# Patient Record
Sex: Male | Born: 1975 | Race: White | Hispanic: No | Marital: Married | Smoking: Never smoker
Health system: Southern US, Community
[De-identification: ages and names within clinical notes are randomized; demographics above are authoritative.]

## PROBLEM LIST (undated history)

## (undated) DIAGNOSIS — T7840XA Allergy, unspecified, initial encounter: Secondary | ICD-10-CM

## (undated) DIAGNOSIS — E785 Hyperlipidemia, unspecified: Secondary | ICD-10-CM

## (undated) DIAGNOSIS — K529 Noninfective gastroenteritis and colitis, unspecified: Secondary | ICD-10-CM

## (undated) HISTORY — DX: Allergy, unspecified, initial encounter: T78.40XA

---

## 1998-12-03 ENCOUNTER — Emergency Department (HOSPITAL_COMMUNITY): Admission: EM | Admit: 1998-12-03 | Discharge: 1998-12-03 | Payer: Self-pay

## 2011-02-05 ENCOUNTER — Ambulatory Visit (HOSPITAL_BASED_OUTPATIENT_CLINIC_OR_DEPARTMENT_OTHER)
Admission: RE | Admit: 2011-02-05 | Discharge: 2011-02-05 | Disposition: A | Discharge status: Home / Self Care | Payer: 59 | Source: Ambulatory Visit | Attending: Family Medicine | Admitting: Family Medicine

## 2011-02-05 ENCOUNTER — Other Ambulatory Visit (HOSPITAL_BASED_OUTPATIENT_CLINIC_OR_DEPARTMENT_OTHER): Payer: Self-pay | Admitting: Family Medicine

## 2011-02-05 DIAGNOSIS — R109 Unspecified abdominal pain: Secondary | ICD-10-CM

## 2011-02-05 DIAGNOSIS — K921 Melena: Secondary | ICD-10-CM | POA: Insufficient documentation

## 2011-02-05 MED ORDER — IOHEXOL 300 MG/ML  SOLN
100.0000 mL | Freq: Once | INTRAMUSCULAR | Status: AC | PRN
  Administered 2011-02-05: 100 mL via INTRAVENOUS

## 2011-02-08 ENCOUNTER — Emergency Department (HOSPITAL_BASED_OUTPATIENT_CLINIC_OR_DEPARTMENT_OTHER)
Admission: EM | Admit: 2011-02-08 | Discharge: 2011-02-08 | Disposition: A | Discharge status: Home / Self Care | Payer: 59 | Attending: Emergency Medicine | Admitting: Emergency Medicine

## 2011-02-08 DIAGNOSIS — K5289 Other specified noninfective gastroenteritis and colitis: Secondary | ICD-10-CM | POA: Insufficient documentation

## 2011-02-08 DIAGNOSIS — K625 Hemorrhage of anus and rectum: Secondary | ICD-10-CM | POA: Insufficient documentation

## 2011-02-08 DIAGNOSIS — R109 Unspecified abdominal pain: Secondary | ICD-10-CM | POA: Insufficient documentation

## 2011-02-08 LAB — CBC
HCT: 40.4 % (ref 39.0–52.0)
Platelets: 216 10*3/uL (ref 150–400)
RDW: 12.3 % (ref 11.5–15.5)
WBC: 14.6 10*3/uL — ABNORMAL HIGH (ref 4.0–10.5)

## 2011-02-08 LAB — BASIC METABOLIC PANEL
Calcium: 9 mg/dL (ref 8.4–10.5)
GFR calc Af Amer: >60 mL/min (ref >60)
GFR calc non Af Amer: >60 mL/min (ref >60)
Glucose, Bld: 98 mg/dL (ref 70–99)
Potassium: 4.2 mEq/L (ref 3.5–5.1)
Sodium: 140 mEq/L (ref 135–145)

## 2011-02-08 LAB — DIFFERENTIAL
Basophils Absolute: 0 10*3/uL (ref 0.0–0.1)
Basophils Relative: 0 % (ref 0–1)
Eosinophils Relative: 1 % (ref 0–5)
Lymphocytes Relative: 8 % — ABNORMAL LOW (ref 12–46)

## 2016-09-02 ENCOUNTER — Ambulatory Visit (HOSPITAL_BASED_OUTPATIENT_CLINIC_OR_DEPARTMENT_OTHER)
Admission: RE | Admit: 2016-09-02 | Discharge: 2016-09-02 | Disposition: A | Discharge status: Home / Self Care | Payer: BLUE CROSS/BLUE SHIELD | Source: Ambulatory Visit | Attending: Sports Medicine | Admitting: Sports Medicine

## 2016-09-02 ENCOUNTER — Ambulatory Visit (INDEPENDENT_AMBULATORY_CARE_PROVIDER_SITE_OTHER): Payer: BLUE CROSS/BLUE SHIELD | Admitting: Sports Medicine

## 2016-09-02 ENCOUNTER — Other Ambulatory Visit: Payer: Self-pay | Admitting: Sports Medicine

## 2016-09-02 ENCOUNTER — Ambulatory Visit (INDEPENDENT_AMBULATORY_CARE_PROVIDER_SITE_OTHER): Payer: BLUE CROSS/BLUE SHIELD

## 2016-09-02 ENCOUNTER — Encounter (HOSPITAL_BASED_OUTPATIENT_CLINIC_OR_DEPARTMENT_OTHER): Payer: Self-pay

## 2016-09-02 DIAGNOSIS — Z23 Encounter for immunization: Secondary | ICD-10-CM | POA: Diagnosis not present

## 2016-09-02 DIAGNOSIS — G4733 Obstructive sleep apnea (adult) (pediatric): Secondary | ICD-10-CM | POA: Insufficient documentation

## 2016-09-02 DIAGNOSIS — R011 Cardiac murmur, unspecified: Secondary | ICD-10-CM

## 2016-09-02 DIAGNOSIS — Z0000 Encounter for general adult medical examination without abnormal findings: Secondary | ICD-10-CM | POA: Diagnosis not present

## 2016-09-02 DIAGNOSIS — R0602 Shortness of breath: Secondary | ICD-10-CM | POA: Diagnosis not present

## 2016-09-02 DIAGNOSIS — R52 Pain, unspecified: Secondary | ICD-10-CM

## 2016-09-02 DIAGNOSIS — G4719 Other hypersomnia: Secondary | ICD-10-CM

## 2016-09-02 DIAGNOSIS — R229 Localized swelling, mass and lump, unspecified: Secondary | ICD-10-CM

## 2016-09-02 DIAGNOSIS — F419 Anxiety disorder, unspecified: Secondary | ICD-10-CM

## 2016-09-02 NOTE — Assessment & Plan Note (Signed)
Return for pre-and postbronchodilator spirometry

## 2016-09-02 NOTE — Progress Notes (Signed)
  Subjective:    CC: Establish care.   HPI:  Shortness of breath: Fairly vague, occurs often at night, occasionally with anxiety. Also wakes up with excessive daytime sleepiness, does not know whether or not he snores. On further questioning he also has moderate difficulty sleeping, mild anhedonia, fatigue, difficulty concentrating, psychomotor retardation, and moderate difficulty relaxing, restlessness, mild nervousness, difficulty controlling his worry, worrying about different things, irritability. No suicidal or homicidal ideation.  Skin mass: Left-sided lower abdominal, localized just under the skin, has another one on the left side under the left breast.  Past medical history:  Negative.  See flowsheet/record as well for more information.  Surgical history: Negative.  See flowsheet/record as well for more information.  Family history: Negative.  See flowsheet/record as well for more information.  Social history: Negative.  See flowsheet/record as well for more information.  Allergies, and medications have been entered into the medical record, reviewed, and no changes needed.    Review of Systems: No headache, visual changes, nausea, vomiting, diarrhea, constipation, dizziness, abdominal pain, skin rash, fevers, chills, night sweats, swollen lymph nodes, weight loss, chest pain, body aches, joint swelling, muscle aches, shortness of breath, mood changes, visual or auditory hallucinations.  Objective:    General: Well Developed, well nourished, and in no acute distress.  Neuro: Alert and oriented x3, extra-ocular muscles intact, sensation grossly intact.  HEENT: Normocephalic, atraumatic, pupils equal round reactive to light, neck supple, no masses, no lymphadenopathy, thyroid nonpalpable.  Skin: Warm and dry, no rashes noted. There are a couple of 2-3 cm masses, well-defined, rounded, movable, nontender in the subcutaneous tissues of the left lower abdominal. There is also sebaceous cyst  under the left breast. Not inflamed. Nontender. Cardiac: Regular rate and rhythm, no rubs or gallops, there is a 1/6 systolic ejection murmur heard best in the right second intercostal space parasternal Respiratory: Clear to auscultation bilaterally. Not using accessory muscles, speaking in full sentences.  Abdominal: Soft, nontender, nondistended, positive bowel sounds, no masses, no organomegaly.  Musculoskeletal: Shoulder, elbow, wrist, hip, knee, ankle stable, and with full range of motion.  Impression and Recommendations:    The patient was counselled, risk factors were discussed, anticipatory guidance given.  Annual physical exam Flu shot, routine blood work ordered.  Mass of skin Left lower abdomen, feels to be a lipoma, he does have what appears to be a sebaceous cyst under the left breast. We will ultrasound the left lower abdominal masses, they do not seem to bother him. If the sebaceous cyst under the left breast becomes bothersome we will surgically excise it.  Excessive daytime sleepiness Unclear etiology. Ordering a home sleep study. I do think there is an element of anxiety, he will continue his trazodone for now, ultimately we will probably need to increase the dose.  Systolic murmur With some orthopnea. Checking BNP, d-dimer, echo. Chest x-ray. Murmur seems to be systolic ejection suspicious for aortic stenosis.  Shortness of breath Return for pre-and postbronchodilator spirometry

## 2016-09-02 NOTE — Assessment & Plan Note (Signed)
With some orthopnea. Checking BNP, d-dimer, echo. Chest x-ray. Murmur seems to be systolic ejection suspicious for aortic stenosis.

## 2016-09-02 NOTE — Assessment & Plan Note (Signed)
Unclear etiology. Ordering a home sleep study. I do think there is an element of anxiety, he will continue his trazodone for now, ultimately we will probably need to increase the dose.

## 2016-09-02 NOTE — Assessment & Plan Note (Signed)
Flu shot, routine blood work ordered.

## 2016-09-02 NOTE — Assessment & Plan Note (Signed)
Left lower abdomen, feels to be a lipoma, he does have what appears to be a sebaceous cyst under the left breast. We will ultrasound the left lower abdominal masses, they do not seem to bother him. If the sebaceous cyst under the left breast becomes bothersome we will surgically excise it.

## 2016-09-04 ENCOUNTER — Ambulatory Visit (INDEPENDENT_AMBULATORY_CARE_PROVIDER_SITE_OTHER): Payer: BLUE CROSS/BLUE SHIELD

## 2016-09-04 DIAGNOSIS — R1909 Other intra-abdominal and pelvic swelling, mass and lump: Secondary | ICD-10-CM | POA: Diagnosis not present

## 2016-09-04 DIAGNOSIS — R229 Localized swelling, mass and lump, unspecified: Secondary | ICD-10-CM

## 2016-09-04 LAB — LIPID PANEL
Cholesterol: 219 mg/dL — ABNORMAL HIGH (ref <200)
HDL: 50 mg/dL (ref >40)
LDL Cholesterol: 141 mg/dL — ABNORMAL HIGH (ref <100)
Total CHOL/HDL Ratio: 4.4 Ratio (ref <5.0)
Triglycerides: 142 mg/dL (ref <150)
VLDL: 28 mg/dL (ref <30)

## 2016-09-04 LAB — COMPREHENSIVE METABOLIC PANEL WITH GFR
ALT: 24 U/L (ref 9–46)
AST: 19 U/L (ref 10–40)
Albumin: 4.4 g/dL (ref 3.6–5.1)
Creat: 1.01 mg/dL (ref 0.60–1.35)
Sodium: 141 mmol/L (ref 135–146)
Total Bilirubin: 0.6 mg/dL (ref 0.2–1.2)
Total Protein: 6.8 g/dL (ref 6.1–8.1)

## 2016-09-04 LAB — COMPREHENSIVE METABOLIC PANEL
Alkaline Phosphatase: 62 U/L (ref 40–115)
BUN: 13 mg/dL (ref 7–25)
CO2: 29 mmol/L (ref 20–31)
Calcium: 9.4 mg/dL (ref 8.6–10.3)
Chloride: 103 mmol/L (ref 98–110)
Glucose, Bld: 98 mg/dL (ref 65–99)
Potassium: 4.3 mmol/L (ref 3.5–5.3)

## 2016-09-04 LAB — TSH: TSH: 1.22 mIU/L (ref 0.40–4.50)

## 2016-09-04 LAB — CBC
HCT: 45.6 % (ref 38.5–50.0)
Hemoglobin: 15.2 g/dL (ref 13.2–17.1)
MCH: 29.7 pg (ref 27.0–33.0)
MCHC: 33.3 g/dL (ref 32.0–36.0)
MCV: 89.2 fL (ref 80.0–100.0)
MPV: 10.7 fL (ref 7.5–12.5)
Platelets: 230 K/uL (ref 140–400)
RBC: 5.11 MIL/uL (ref 4.20–5.80)
RDW: 13.1 % (ref 11.0–15.0)
WBC: 6.7 K/uL (ref 3.8–10.8)

## 2016-09-05 ENCOUNTER — Encounter: Payer: Self-pay | Admitting: Sports Medicine

## 2016-09-05 DIAGNOSIS — E785 Hyperlipidemia, unspecified: Secondary | ICD-10-CM | POA: Insufficient documentation

## 2016-09-05 LAB — TESTOSTERONE TOTAL,FREE,BIO, MALES
Albumin: 4.4 g/dL (ref 3.6–5.1)
Sex Hormone Binding: 37 nmol/L (ref 10–50)
Testosterone, Bioavailable: 133.9 ng/dL (ref 110.0–575.0)
Testosterone, Free: 66.5 pg/mL (ref 46.0–224.0)
Testosterone: 534 ng/dL (ref 250–827)

## 2016-09-05 LAB — HEMOGLOBIN A1C
Hgb A1c MFr Bld: 5.4 % (ref <5.7)
Mean Plasma Glucose: 108 mg/dL

## 2016-09-05 LAB — D-DIMER, QUANTITATIVE: D-Dimer, Quant: <0.19 ug{FEU}/mL (ref <0.50)

## 2016-09-05 LAB — BRAIN NATRIURETIC PEPTIDE: Brain Natriuretic Peptide: 7.1 pg/mL (ref <100)

## 2016-09-05 LAB — VITAMIN D 25 HYDROXY (VIT D DEFICIENCY, FRACTURES): Vit D, 25-Hydroxy: 30 ng/mL (ref 30–100)

## 2016-09-09 ENCOUNTER — Telehealth: Payer: Self-pay

## 2016-09-09 MED ORDER — TRAZODONE HCL 100 MG PO TABS: 100.0000 mg | ORAL_TABLET | Freq: Every day | ORAL | 3 refills | Status: DC

## 2016-09-09 NOTE — Telephone Encounter (Signed)
Patient advised of medication increase 

## 2016-09-09 NOTE — Telephone Encounter (Signed)
Pt would like to know if you can refill his Trazodone and would also like to know if an increase on medications is appropriate. Please advise.

## 2016-09-09 NOTE — Telephone Encounter (Signed)
Not a problem, increasing trazodone to 100 mg daily at bedtime

## 2016-09-10 ENCOUNTER — Ambulatory Visit (HOSPITAL_BASED_OUTPATIENT_CLINIC_OR_DEPARTMENT_OTHER): Payer: BLUE CROSS/BLUE SHIELD

## 2016-09-24 DIAGNOSIS — G4730 Sleep apnea, unspecified: Secondary | ICD-10-CM | POA: Diagnosis not present

## 2016-09-26 ENCOUNTER — Encounter: Payer: Self-pay | Admitting: Sports Medicine

## 2016-09-29 ENCOUNTER — Telehealth: Payer: Self-pay

## 2016-09-29 NOTE — Telephone Encounter (Signed)
Called pt and informed him of results. Pt will call back to make a nurse visit for neck circumference after he checks his schedule.

## 2016-09-29 NOTE — Telephone Encounter (Signed)
-----   Message from Silverio Decamp, MD sent at 09/26/2016 11:59 AM EST ----- Please let patient know he was positive for OSA and needs to give [place] his neck circumference.  This then needs to be sent through Darnelle Bos with [employer] to set up CPAP. ___________________________________________ Gwen Her. Dianah Field, M.D., ABFM., CAQSM. Primary Care and [employer] [place] [facility]

## 2016-10-10 ENCOUNTER — Ambulatory Visit: Payer: BLUE CROSS/BLUE SHIELD

## 2016-10-27 ENCOUNTER — Telehealth: Payer: Self-pay

## 2016-10-27 DIAGNOSIS — G4733 Obstructive sleep apnea (adult) (pediatric): Secondary | ICD-10-CM

## 2016-10-27 NOTE — Telephone Encounter (Signed)
Pt neck circumference taken and it's 14in. Please assist with CPAP order.

## 2016-10-27 NOTE — Telephone Encounter (Signed)
Referral to [facility]

## 2016-11-10 ENCOUNTER — Other Ambulatory Visit: Payer: Self-pay

## 2016-11-10 MED ORDER — TRAZODONE HCL 100 MG PO TABS: 100.0000 mg | ORAL_TABLET | Freq: Every day | ORAL | 0 refills | Status: DC

## 2016-11-26 DIAGNOSIS — G4733 Obstructive sleep apnea (adult) (pediatric): Secondary | ICD-10-CM | POA: Diagnosis not present

## 2016-12-08 DIAGNOSIS — J101 Influenza due to other identified influenza virus with other respiratory manifestations: Secondary | ICD-10-CM | POA: Diagnosis not present

## 2016-12-23 DIAGNOSIS — J302 Other seasonal allergic rhinitis: Secondary | ICD-10-CM | POA: Diagnosis not present

## 2016-12-23 DIAGNOSIS — J0140 Acute pansinusitis, unspecified: Secondary | ICD-10-CM | POA: Diagnosis not present

## 2016-12-24 DIAGNOSIS — G4733 Obstructive sleep apnea (adult) (pediatric): Secondary | ICD-10-CM | POA: Diagnosis not present

## 2017-01-24 DIAGNOSIS — G4733 Obstructive sleep apnea (adult) (pediatric): Secondary | ICD-10-CM | POA: Diagnosis not present

## 2017-02-07 ENCOUNTER — Other Ambulatory Visit: Payer: Self-pay | Admitting: Sports Medicine

## 2017-02-23 DIAGNOSIS — G4733 Obstructive sleep apnea (adult) (pediatric): Secondary | ICD-10-CM | POA: Diagnosis not present

## 2017-03-26 DIAGNOSIS — G4733 Obstructive sleep apnea (adult) (pediatric): Secondary | ICD-10-CM | POA: Diagnosis not present

## 2017-04-21 DIAGNOSIS — M5126 Other intervertebral disc displacement, lumbar region: Secondary | ICD-10-CM | POA: Diagnosis not present

## 2017-04-21 DIAGNOSIS — M5137 Other intervertebral disc degeneration, lumbosacral region: Secondary | ICD-10-CM | POA: Diagnosis not present

## 2017-04-21 DIAGNOSIS — M545 Low back pain: Secondary | ICD-10-CM | POA: Diagnosis not present

## 2017-05-15 DIAGNOSIS — G4733 Obstructive sleep apnea (adult) (pediatric): Secondary | ICD-10-CM | POA: Diagnosis not present

## 2017-05-17 ENCOUNTER — Other Ambulatory Visit: Payer: Self-pay | Admitting: Sports Medicine

## 2017-06-15 DIAGNOSIS — G4733 Obstructive sleep apnea (adult) (pediatric): Secondary | ICD-10-CM | POA: Diagnosis not present

## 2017-07-16 DIAGNOSIS — G4733 Obstructive sleep apnea (adult) (pediatric): Secondary | ICD-10-CM | POA: Diagnosis not present

## 2017-08-24 ENCOUNTER — Other Ambulatory Visit: Payer: Self-pay | Admitting: Sports Medicine

## 2017-10-06 DIAGNOSIS — J020 Streptococcal pharyngitis: Secondary | ICD-10-CM | POA: Diagnosis not present

## 2017-10-20 DIAGNOSIS — J020 Streptococcal pharyngitis: Secondary | ICD-10-CM | POA: Diagnosis not present

## 2017-11-23 ENCOUNTER — Other Ambulatory Visit: Payer: Self-pay | Admitting: Sports Medicine

## 2017-12-28 DIAGNOSIS — J020 Streptococcal pharyngitis: Secondary | ICD-10-CM | POA: Diagnosis not present

## 2018-02-12 ENCOUNTER — Encounter: Payer: Self-pay | Admitting: Osteopathic Medicine

## 2018-02-12 ENCOUNTER — Ambulatory Visit: Payer: BLUE CROSS/BLUE SHIELD | Admitting: Osteopathic Medicine

## 2018-02-12 VITALS — BP 124/70 | HR 60 | Temp 98.0°F | Wt 170.0 lb

## 2018-02-12 DIAGNOSIS — K529 Noninfective gastroenteritis and colitis, unspecified: Secondary | ICD-10-CM | POA: Diagnosis not present

## 2018-02-12 MED ORDER — METRONIDAZOLE 500 MG PO TABS: 500.0000 mg | ORAL_TABLET | Freq: Three times a day (TID) | ORAL | 0 refills | Status: AC

## 2018-02-12 MED ORDER — CIPROFLOXACIN HCL 500 MG PO TABS: 500.0000 mg | ORAL_TABLET | Freq: Two times a day (BID) | ORAL | 0 refills | Status: AC

## 2018-02-12 NOTE — Patient Instructions (Addendum)
See below for full details but plan is as follows:  Treat infection with antibiotics, do not stop taking antibiotics even if you start to feel better  Relative bowel rest with progression of diet from clear liquids to full liquids to bland diet to solid foods as you start to feel better.    If worsening of symptoms, especially if fever or more severe abdominal pain, please seek care at a hospital emergency room.  Occasionally hospitalization is needed for IV fluids, IV antibiotics, and worst case scenario surgery may be needed   Colitis Colitis is inflammation of the colon. Colitis may last a short time (acute) or it may last a long time (chronic). What are the causes? This condition may be caused by:  Viruses.  Bacteria.  Reactions to medicine.  Certain autoimmune diseases, such as Crohn disease or ulcerative colitis.  What are the signs or symptoms? Symptoms of this condition include:  Diarrhea.  Passing bloody or tarry stool.  Pain.  Fever.  Vomiting.  Tiredness (fatigue).  Weight loss.  Bloating.  Sudden increase in abdominal pain.  Having fewer bowel movements than usual.  How is this diagnosed? This condition is diagnosed with a stool test or a blood test. You may also have other tests, including X-rays, a CT scan, or a colonoscopy. How is this treated? Treatment may include:  Resting the bowel. This involves not eating or drinking for a period of time.  Fluids that are given through an IV tube.  Medicine for pain and diarrhea.  Antibiotic medicines.  Cortisone medicines.  Surgery.  Follow these instructions at home: Eating and drinking  Follow instructions from your health care provider about eating or drinking restrictions.  Drink enough fluid to keep your urine clear or pale yellow.  Work with a dietitian to determine which foods cause your condition to flare up.  Avoid foods that cause flare-ups.  Eat a well-balanced  diet. Medicines  Take over-the-counter and prescription medicines only as told by your health care provider.  If you were prescribed an antibiotic medicine, take it as told by your health care provider. Do not stop taking the antibiotic even if you start to feel better. General instructions  Keep all follow-up visits as told by your health care provider. This is important. Contact a health care provider if:  Your symptoms do not go away.  You develop new symptoms. Get help right away if:  You have a fever that does not go away with treatment.  You develop chills.  You have extreme weakness, fainting, or dehydration.  You have repeated vomiting.  You develop severe pain in your abdomen.  You pass bloody or tarry stool. This information is not intended to replace advice given to you by your health care provider. Make sure you discuss any questions you have with your health care provider. Document Released: 11/13/2004 Document Revised: 03/13/2016 Document Reviewed: 01/29/2015 Elsevier Interactive Patient Education  2018 [place] Liquid Diet, Adult A clear liquid diet is a diet that includes only liquids that you can see through. You may need to follow a clear liquid diet if:  You develop a medical condition right before or after you have surgery.  You were not able to eat food for a long period of time.  You had a condition that gave you diarrhea.  You are going to have an exam, such as a colonoscopy, in which instruments will be put into your body to look at parts  of your digestive system.  You are going to have bowel surgery.  The usual goals of this diet are:  To rest the stomach and digestive system as much as possible.  To keep you hydrated.  To make sure you get some calories for energy.  To help you return to normal digestion.  Most people need to follow this diet for only a short period of time. What do I need to know about this diet?  A  clear liquid is a liquid that you can see through when you hold it up to a light.  A clear liquid diet does not provide all the nutrients that you need. It is important to choose a variety of the liquids that are allowed on this diet. That way, you will get as many nutrients as possible.  If you are not sure whether you can have certain items, ask your health care provider. What can I have?  Water and flavored water.  Fruit juices that do not have pulp, such as cranberry juice and apple juice.  Tea and coffee without milk or cream.  Clear bouillon or broth.  Broth-based soups that have been strained.  Flavored gelatins.  Honey.  Sugar water.  Frozen ice or frozen ice pops that do not contain milk, yogurt, fruit pieces, or fruit pulp.  Clear sodas.  Clear sports drinks. The items listed above may not be a complete list of recommended liquids. Contact your dietitian for more options. What can I not have?  Juices that have pulp.  Milk.  Cream or cream-based soups.  Yogurt. The items listed above may not be a complete list of liquids to avoid. Contact your dietitian for more information. Summary  A clear liquid diet is a diet that includes only liquids that you can see through.  The goal of this diet is to help you recover by resting your digestive system, keeping you hydrated, and providing nutrients.  Make sure to avoid liquids with milk, cream, or pulp while on this diet. This information is not intended to replace advice given to you by your health care provider. Make sure you discuss any questions you have with your health care provider. Document Released: 10/06/2005 Document Revised: 05/20/2016 Document Reviewed: 09/02/2013 Elsevier Interactive Patient Education  2018 [place].  Full Liquid Diet A full liquid diet may be used:  To help you transition from a clear liquid diet to a soft diet.  When your body is healing and can only tolerate foods that are  easy to digest.  Before or after certain a procedure, test, or surgery (such as stomach or intestinal surgeries).  If you have trouble swallowing or chewing.  A full liquid diet includes fluids and foods that are liquid or will become liquid at room temperature. The full liquid diet gives you the proteins, fluids, salts, and minerals that you need for energy. If you continue this diet for more than 72 hours, talk to your health care provider about how many calories you need to consume. If you continue the diet for more than 5 days, talk to your health care provider about taking a multivitamin or a nutritional supplement. What do I need to know about a full liquid diet?  You may have any liquid.  You may have any food that becomes a liquid at room temperature. The food is considered a liquid if it can be poured off a spoon at room temperature.  Drink one serving of citrus or vitamin C-enriched fruit  juice daily. What foods can I eat? Grains Any grain food that can be pureed in soup (such as crackers, pasta, and rice). Hot cereal (such as farina or oatmeal) that has been blended. Talk to your health care provider or dietitian about these foods. Vegetables Pulp-free tomato or vegetable juice. Vegetables pureed in soup. Fruits Fruit juice, including nectars and juices with pulp. Meats and Other Protein Sources Eggs in custard, eggnog mix, and eggs used in ice cream or pudding. Strained meats, like in baby food, may be allowed. Consult your health care provider. Dairy Milk and milk-based beverages, including milk shakes and instant breakfast mixes. Smooth yogurt. Pureed cottage cheese. Avoid these foods if they are not well tolerated. Beverages All beverages, including liquid nutritional supplements. Ask your health care provider if you can have carbonated beverages. They may not be well tolerated. Condiments Iodized salt, pepper, spices, and flavorings. Cocoa powder. Vinegar, ketchup, yellow  mustard, smooth sauces (such as hollandaise, cheese sauce, or white sauce), and soy sauce. Sweets and Desserts Custard, smooth pudding. Flavored gelatin. Tapioca, junket. Plain ice cream, sherbet, fruit ices. Frozen ice pops, frozen fudge pops, pudding pops, and other frozen bars with cream. Syrups, including chocolate syrup. Sugar, honey, jelly. Fats and Oils Margarine, butter, cream, sour cream, and oils. Other Broth and cream soups. Strained, broth-based soups. The items listed above may not be a complete list of recommended foods or beverages. Contact your dietitian for more options. What foods can I not eat? Grains All breads. Grains are not allowed unless they are pureed into soup. Vegetables Vegetables are not allowed unless they are juiced, or cooked and pureed into soup. Fruits Fruits are not allowed unless they are juiced. Meats and Other Protein Sources Any meat or fish. Cooked or raw eggs. Nut butters. Dairy Cheese. Condiments Stone ground mustards. Fats and Oils Fats that are coarse or chunky. Sweets and Desserts Ice cream or other frozen desserts that have any solids in them or on top, such as nuts, chocolate chips, and pieces of cookies. Cakes. Cookies. Candy. Others Soups with chunks or pieces in them. The items listed above may not be a complete list of foods and beverages to avoid. Contact your dietitian for more information. This information is not intended to replace advice given to you by your health care provider. Make sure you discuss any questions you have with your health care provider. Document Released: 10/06/2005 Document Revised: 03/13/2016 Document Reviewed: 08/11/2013 Elsevier Interactive Patient Education  2017 [place] Diet A bland diet consists of foods that do not have a lot of fat or fiber. Foods without fat or fiber are easier for the body to digest. They are also less likely to irritate your mouth, throat, stomach, and other parts  of your gastrointestinal tract. A bland diet is sometimes called a BRAT diet. What is my plan? Your health care provider or dietitian may recommend specific changes to your diet to prevent and treat your symptoms, such as:  Eating small meals often.  Cooking food until it is soft enough to chew easily.  Chewing your food well.  Drinking fluids slowly.  Not eating foods that are very spicy, sour, or fatty.  Not eating citrus fruits, such as oranges and grapefruit.  What do I need to know about this diet?  Eat a variety of foods from the bland diet food list.  Do not follow a bland diet longer than you have to.  Ask your health care provider whether  you should take vitamins. What foods can I eat? Grains  Hot cereals, such as cream of wheat. Bread, crackers, or tortillas made from refined white flour. Rice. Vegetables Canned or cooked vegetables. Mashed or boiled potatoes. Fruits Bananas. Applesauce. Other types of cooked or canned fruit with the skin and seeds removed, such as canned peaches or pears. Meats and Other Protein Sources Scrambled eggs. Creamy peanut butter or other nut butters. Lean, well-cooked meats, such as chicken or fish. Tofu. Soups or broths. Dairy Low-fat dairy products, such as milk, cottage cheese, or yogurt. Beverages Water. Herbal tea. Apple juice. Sweets and Desserts Pudding. Custard. Fruit gelatin. Ice cream. Fats and Oils Mild salad dressings. Canola or olive oil. The items listed above may not be a complete list of allowed foods or beverages. Contact your dietitian for more options. What foods are not recommended? Foods and ingredients that are often not recommended include:  Spicy foods, such as hot sauce or salsa.  Fried foods.  Sour foods, such as pickled or fermented foods.  Raw vegetables or fruits, especially citrus or berries.  Caffeinated drinks.  Alcohol.  Strongly flavored seasonings or condiments.  The items listed above  may not be a complete list of foods and beverages that are not allowed. Contact your dietitian for more information. This information is not intended to replace advice given to you by your health care provider. Make sure you discuss any questions you have with your health care provider. Document Released: 01/28/2016 Document Revised: 03/13/2016 Document Reviewed: 10/18/2014 Elsevier Interactive Patient Education  2018 [employer].

## 2018-02-12 NOTE — Progress Notes (Signed)
HPI: Peter Knight is a 42 y.o. male who  has no past medical history on file.  he presents to [facility] today, 02/12/18,  for chief complaint of:  Bloody stool   . Feels similar to colitis episode years ago.  Symptoms started last night though he notes bowel movements over the past few weeks have been a bit irregular, some constipation.  . (+)urgency feeling with eating, not always resulting in BM. Feels fullness in lower abdomen. 2 bloody BM yesterday night and today. He actually felt pretty good yesterday, went hiking with his kids.    Past medical history, surgical history, and family history reviewed.  Current medication list and allergy/intolerance information reviewed.   (See remainder of HPI, as well as ROS, PE below)    ASSESSMENT/PLAN:   Colitis   Meds ordered this encounter  Medications  . metroNIDAZOLE (FLAGYL) 500 MG tablet    Sig: Take 1 tablet (500 mg total) by mouth 3 (three) times daily for 10 days.    Dispense:  30 tablet    Refill:  0  . ciprofloxacin (CIPRO) 500 MG tablet    Sig: Take 1 tablet (500 mg total) by mouth 2 (two) times daily for 10 days.    Dispense:  20 tablet    Refill:  0    See printed instructions regarding colitis, diet, ER precautions.  Given history of colitis episode which feels very similar to this and obvious symptoms/physical exam, I do not see need to do imaging at this point unless failing to improve, or gets worse  Follow-up plan: Return for Routine care as directed by primary physician, otherwise as needed.      ^^^^^^^^^^^^^^^^^^^^^^^^^^^^^ ^^^^^^^^^^^^^^^^^^^^^^^^^^^^^ ^^^^^^^^^^^^^^^^^^^^^^^^^^^^^  Outpatient Encounter Medications as of 02/12/2018  Medication Sig  . traZODone (DESYREL) 100 MG tablet TAKE 1 TABLET (100 MG TOTAL) BY MOUTH AT BEDTIME.   No facility-administered encounter medications on file as of 02/12/2018.    No Known Allergies    Review of  Systems:  Constitutional: No recent illness  HEENT: No  headache, no vision change  Cardiac: No  chest pain, No  pressure, No palpitations  Respiratory:  No  shortness of breath. No  Cough  Gastrointestinal: +abdominal pain, +change on bowel habits see HPI  Musculoskeletal: No new myalgia/arthralgia  Skin: No  Rash  Hem/Onc: No  easy bruising/bleeding, No  abnormal lumps/bumps  Neurologic: No  weakness, No  Dizziness  Psychiatric: No  concerns with depression, No  concerns with anxiety  Exam:  BP 124/70 (BP Location: Left Arm, Patient Position: Sitting, Cuff Size: Normal)   Pulse 60   Temp 98 F (36.7 C) (Oral)   Wt 170 lb (77.1 kg)   BMI 26.23 kg/m   Constitutional: VS see above. General Appearance: alert, well-developed, well-nourished, NAD  Eyes: Normal lids and conjunctive, non-icteric sclera  Ears, Nose, Mouth, Throat: MMM, Normal external inspection ears/nares/mouth/lips/gums.  Neck: No masses, trachea midline.   Respiratory: Normal respiratory effort. no wheeze, no rhonchi, no rales  Cardiovascular: S1/S2 normal, RRR  Abdominal: Bowel sounds within normal limits x4, tenderness left lower quadrant, no distention guarding or rebound  Musculoskeletal: Gait normal. Symmetric and independent movement of all extremities  Neurological: Normal balance/coordination. No tremor.  Skin: warm, dry, intact.   Psychiatric: Normal judgment/insight. Normal mood and affect. Oriented x3.   Visit summary with medication list and pertinent instructions was printed for patient to review, alert [place] if any changes needed. All questions at  time of visit were answered - patient instructed to contact office with any additional concerns. ER/RTC precautions were reviewed with the patient and understanding verbalized.   Follow-up plan: Return for Routine care as directed by primary physician, otherwise as needed.    Please note: voice recognition software was used to produce this  document, and typos may escape review. Please contact Dr. Sheppard Coil for any needed clarifications.

## 2018-02-19 ENCOUNTER — Other Ambulatory Visit: Payer: Self-pay | Admitting: Sports Medicine

## 2018-02-23 ENCOUNTER — Other Ambulatory Visit: Payer: Self-pay | Admitting: Sports Medicine

## 2018-03-12 DIAGNOSIS — J029 Acute pharyngitis, unspecified: Secondary | ICD-10-CM | POA: Diagnosis not present

## 2018-03-16 DIAGNOSIS — J0110 Acute frontal sinusitis, unspecified: Secondary | ICD-10-CM | POA: Diagnosis not present

## 2018-04-14 DIAGNOSIS — J0300 Acute streptococcal tonsillitis, unspecified: Secondary | ICD-10-CM | POA: Diagnosis not present

## 2018-05-28 ENCOUNTER — Other Ambulatory Visit: Payer: Self-pay | Admitting: Sports Medicine

## 2018-05-29 ENCOUNTER — Other Ambulatory Visit: Payer: Self-pay | Admitting: Sports Medicine

## 2018-07-26 ENCOUNTER — Other Ambulatory Visit: Payer: Self-pay | Admitting: Sports Medicine

## 2018-07-26 DIAGNOSIS — K529 Noninfective gastroenteritis and colitis, unspecified: Secondary | ICD-10-CM

## 2018-07-26 NOTE — Assessment & Plan Note (Signed)
Persistent hematochezia, history of colitis. Referral to gastroenterology, he was not able to get in yet. His father sent me a message on my chart.

## 2018-07-27 ENCOUNTER — Ambulatory Visit: Payer: BLUE CROSS/BLUE SHIELD | Admitting: Sports Medicine

## 2018-07-27 DIAGNOSIS — R109 Unspecified abdominal pain: Secondary | ICD-10-CM | POA: Diagnosis not present

## 2018-07-27 DIAGNOSIS — R011 Cardiac murmur, unspecified: Secondary | ICD-10-CM

## 2018-07-27 DIAGNOSIS — E785 Hyperlipidemia, unspecified: Secondary | ICD-10-CM

## 2018-07-27 DIAGNOSIS — K529 Noninfective gastroenteritis and colitis, unspecified: Secondary | ICD-10-CM

## 2018-07-27 NOTE — Assessment & Plan Note (Signed)
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 Now with lower abdominal pain. Checking some routine labs and referral to gastroenterology. I did a referral yesterday to [place] at [facility].

## 2018-07-27 NOTE — Progress Notes (Addendum)
Subjective:    CC: Hematochezia  HPI: Peter Knight is a pleasant 42 year old male, I have not seen him in 2 years, some years back he was diagnosed with colitis, he had hematochezia and descending, transverse colitis on CT.  This resolved.  Lately he has been having several episodes of hematochezia, lower abdominal pain.  Occasional defecation syncope.  He has not yet been seen by gastroenterologist.  Symptoms are moderate, persistent.  In addition we diagnosed him with a systolic murmur, considering his syncope we had ordered an echocardiogram which he never proceeded with.  I reviewed the past medical history, family history, social history, surgical history, and allergies today and no changes were needed.  Please see the problem list section below in epic for further details.  Past Medical History: No past medical history on file. Past Surgical History: No past surgical history on file. Social History: Social History   Socioeconomic History  . Marital status: Married    Spouse name: Not on file  . Number of children: Not on file  . Years of education: Not on file  . Highest education level: Not on file  Occupational History  . Not on file  Social Needs  . Financial resource strain: Not on file  . Food insecurity:    Worry: Not on file    Inability: Not on file  . Transportation needs:    Medical: Not on file    Non-medical: Not on file  Tobacco Use  . Smoking status: Never Smoker  . Smokeless tobacco: Never Used  Substance and Sexual Activity  . Alcohol use: Yes    Frequency: Never  . Drug use: Never  . Sexual activity: Not on file  Lifestyle  . Physical activity:    Days per week: Not on file    Minutes per session: Not on file  . Stress: Not on file  Relationships  . Social connections:    Talks on phone: Not on file    Gets together: Not on file    Attends religious service: Not on file    Active member of club or organization: Not on file    Attends meetings of  clubs or organizations: Not on file    Relationship status: Not on file  Other Topics Concern  . Not on file  Social History Narrative  . Not on file   Family History: No family history on file. Allergies: Allergies  Allergen Reactions  . Naproxen Other (See Comments)    Low abdominal pain   Medications: See med rec.  Review of Systems: No fevers, chills, night sweats, weight loss, chest pain, or shortness of breath.   Objective:    General: Well Developed, well nourished, and in no acute distress.  Neuro: Alert and oriented x3, extra-ocular muscles intact, sensation grossly intact.  HEENT: Normocephalic, atraumatic, pupils equal round reactive to light, neck supple, no masses, no lymphadenopathy, thyroid nonpalpable.  Skin: Warm and dry, no rashes. Cardiac: Regular rate and rhythm, no murmurs rubs or gallops, no lower extremity edema.  Respiratory: Clear to auscultation bilaterally. Not using accessory muscles, speaking in full sentences. Abdomen: Soft, nontender, nondistended, bowel sounds, no palpable masses, no guarding, rigidity, rebound tenderness.  Impression and Recommendations:    Systolic murmur Never got his echo done, normal BNP, d-dimer. Reordering.  Colitis Persistent episodes of hematochezia, history of transverse and descending colitis on CT. Now with lower abdominal pain. Checking some routine labs and referral to gastroenterology. I did a referral yesterday to [facility]  at [facility]  Hyperlipidemia Rechecking lipids.  Starting lipitor 20 qHS.  Recheck in 3 months. ___________________________________________ Gwen Her. Dianah Field, M.D., ABFM., CAQSM. Primary Care and [place] Instructor of [place] [employer] [facility] [employer]

## 2018-07-27 NOTE — Assessment & Plan Note (Signed)
Never got his echo done, normal BNP, d-dimer. Reordering.

## 2018-07-27 NOTE — Assessment & Plan Note (Addendum)
Rechecking lipids.  Starting lipitor 20 qHS.  Recheck in 3 months.

## 2018-07-28 ENCOUNTER — Encounter: Payer: Self-pay | Admitting: Sports Medicine

## 2018-07-28 LAB — ANCA SCREEN W REFLEX TITER: ANCA Screen: NEGATIVE

## 2018-07-28 MED ORDER — ATORVASTATIN CALCIUM 20 MG PO TABS: 20.0000 mg | ORAL_TABLET | Freq: Every day | ORAL | 3 refills | Status: DC

## 2018-07-28 NOTE — Addendum Note (Signed)
Addended by: Silverio Decamp on: 07/28/2018 12:03 PM   Modules accepted: Orders

## 2018-07-29 DIAGNOSIS — K529 Noninfective gastroenteritis and colitis, unspecified: Secondary | ICD-10-CM | POA: Diagnosis not present

## 2018-07-29 DIAGNOSIS — E785 Hyperlipidemia, unspecified: Secondary | ICD-10-CM | POA: Diagnosis not present

## 2018-07-30 LAB — COMPREHENSIVE METABOLIC PANEL
AG Ratio: 2.6 (calc) — ABNORMAL HIGH (ref 1.0–2.5)
Albumin: 4.6 g/dL (ref 3.6–5.1)
Alkaline phosphatase (APISO): 61 U/L (ref 40–115)
Chloride: 101 mmol/L (ref 98–110)
Creat: 1.16 mg/dL (ref 0.60–1.35)
Globulin: 1.8 g/dL (calc) — ABNORMAL LOW (ref 1.9–3.7)
Potassium: 3.9 mmol/L (ref 3.5–5.3)
Total Bilirubin: 0.6 mg/dL (ref 0.2–1.2)

## 2018-07-30 LAB — CBC WITH DIFFERENTIAL/PLATELET
Basophils Absolute: 19 {cells}/uL (ref 0–200)
Basophils Relative: 0.3 %
Eosinophils Absolute: 99 cells/uL (ref 15–500)
Eosinophils Relative: 1.6 %
HCT: 44.2 % (ref 38.5–50.0)
Hemoglobin: 15 g/dL (ref 13.2–17.1)
Lymphs Abs: 1643 {cells}/uL (ref 850–3900)
MCH: 30.4 pg (ref 27.0–33.0)
MCHC: 33.9 g/dL (ref 32.0–36.0)
MCV: 89.5 fL (ref 80.0–100.0)
MPV: 11.1 fL (ref 7.5–12.5)
Monocytes Relative: 7.7 %
Neutro Abs: 3962 cells/uL (ref 1500–7800)
Neutrophils Relative %: 63.9 %
Platelets: 225 10*3/uL (ref 140–400)
RBC: 4.94 Million/uL (ref 4.20–5.80)
RDW: 12.2 % (ref 11.0–15.0)
Total Lymphocyte: 26.5 %
WBC mixed population: 477 cells/uL (ref 200–950)
WBC: 6.2 10*3/uL (ref 3.8–10.8)

## 2018-07-30 LAB — LIPID PANEL W/REFLEX DIRECT LDL
Cholesterol: 245 mg/dL — ABNORMAL HIGH (ref <200)
HDL: 41 mg/dL (ref >40)
LDL Cholesterol (Calc): 168 mg/dL — ABNORMAL HIGH
Non-HDL Cholesterol (Calc): 204 mg/dL (calc) — ABNORMAL HIGH (ref <130)
Total CHOL/HDL Ratio: 6 (calc) — ABNORMAL HIGH (ref <5.0)
Triglycerides: 204 mg/dL — ABNORMAL HIGH (ref <150)

## 2018-07-30 LAB — LIPASE: Lipase: 27 U/L (ref 7–60)

## 2018-07-30 LAB — TSH: TSH: 1.11 mIU/L (ref 0.40–4.50)

## 2018-07-30 LAB — COMPREHENSIVE METABOLIC PANEL WITH GFR
ALT: 25 U/L (ref 9–46)
AST: 18 U/L (ref 10–40)
BUN: 18 mg/dL (ref 7–25)
CO2: 32 mmol/L (ref 20–32)
Calcium: 9.3 mg/dL (ref 8.6–10.3)
Glucose, Bld: 125 mg/dL — ABNORMAL HIGH (ref 65–99)
Sodium: 139 mmol/L (ref 135–146)
Total Protein: 6.4 g/dL (ref 6.1–8.1)

## 2018-07-30 LAB — SACCHAROMYCES CEREVISIAE ANTIBODIES, IGG AND IGA
SACCHAROMYCES CEREVISIAE AB (ASCA)(IGA): 7.1 U (ref <=20.0)
SACCHAROMYCES CEREVISIAE AB (ASCA)(IGG): 13.1 U (ref <=20.0)

## 2018-07-30 LAB — TISSUE TRANSGLUTAMINASE, IGA: (tTG) Ab, IgA: 1 U/mL

## 2018-07-30 LAB — TISSUE TRANSGLUTAMINASE, IGG: (tTG) Ab, IgG: 2 U/mL

## 2018-07-30 LAB — SEDIMENTATION RATE: Sed Rate: 2 mm/h (ref 0–15)

## 2018-08-02 LAB — OVA AND PARASITE EXAMINATION
CONCENTRATE RESULT:: NONE SEEN
MICRO NUMBER:: 91221991
SPECIMEN QUALITY:: ADEQUATE
TRICHROME RESULT:: NONE SEEN

## 2018-08-02 LAB — STOOL CULTURE
MICRO NUMBER:: 91222531
MICRO NUMBER:: 91222532
MICRO NUMBER:: 91222533
SHIGA RESULT:: NOT DETECTED
SPECIMEN QUALITY:: ADEQUATE
SPECIMEN QUALITY:: ADEQUATE
SPECIMEN QUALITY:: ADEQUATE

## 2018-08-02 LAB — FECAL LACTOFERRIN, QUANT
Fecal Lactoferrin: NEGATIVE
MICRO NUMBER:: 91223212
SPECIMEN QUALITY:: ADEQUATE

## 2018-08-02 LAB — C. DIFFICILE GDH AND TOXIN A/B
GDH ANTIGEN: NOT DETECTED
MICRO NUMBER:: 91223247
SPECIMEN QUALITY:: ADEQUATE
TOXIN A AND B: NOT DETECTED

## 2018-08-05 DIAGNOSIS — J029 Acute pharyngitis, unspecified: Secondary | ICD-10-CM | POA: Diagnosis not present

## 2018-08-05 DIAGNOSIS — J0140 Acute pansinusitis, unspecified: Secondary | ICD-10-CM | POA: Diagnosis not present

## 2018-08-06 ENCOUNTER — Encounter: Payer: Self-pay | Admitting: Sports Medicine

## 2018-08-06 NOTE — Telephone Encounter (Signed)
Would you please look into what is going on with the GI referral for this guy?

## 2018-08-10 ENCOUNTER — Ambulatory Visit (HOSPITAL_BASED_OUTPATIENT_CLINIC_OR_DEPARTMENT_OTHER)
Admission: RE | Admit: 2018-08-10 | Discharge: 2018-08-10 | Disposition: A | Discharge status: Home / Self Care | Payer: BLUE CROSS/BLUE SHIELD | Source: Ambulatory Visit | Attending: Sports Medicine | Admitting: Sports Medicine

## 2018-08-10 ENCOUNTER — Ambulatory Visit (HOSPITAL_BASED_OUTPATIENT_CLINIC_OR_DEPARTMENT_OTHER): Payer: BLUE CROSS/BLUE SHIELD

## 2018-08-10 ENCOUNTER — Telehealth: Payer: Self-pay | Admitting: Sports Medicine

## 2018-08-10 DIAGNOSIS — E785 Hyperlipidemia, unspecified: Secondary | ICD-10-CM | POA: Diagnosis not present

## 2018-08-10 DIAGNOSIS — I371 Nonrheumatic pulmonary valve insufficiency: Secondary | ICD-10-CM | POA: Insufficient documentation

## 2018-08-10 DIAGNOSIS — R011 Cardiac murmur, unspecified: Secondary | ICD-10-CM | POA: Insufficient documentation

## 2018-08-10 NOTE — Progress Notes (Signed)
  Echocardiogram 2D Echocardiogram has been performed.  Lucill Mauck T Tyriq Moragne 08/10/2018, 11:24 AM

## 2018-08-10 NOTE — Telephone Encounter (Signed)
Patient is scheduled on 08/11/18 with [facility] in [place]

## 2018-08-11 ENCOUNTER — Ambulatory Visit: Payer: BLUE CROSS/BLUE SHIELD | Admitting: Gastroenterology

## 2018-08-11 ENCOUNTER — Encounter: Payer: Self-pay | Admitting: Gastroenterology

## 2018-08-11 VITALS — BP 126/72 | HR 77 | Ht 68.0 in | Wt 173.0 lb

## 2018-08-11 DIAGNOSIS — K529 Noninfective gastroenteritis and colitis, unspecified: Secondary | ICD-10-CM | POA: Diagnosis not present

## 2018-08-11 DIAGNOSIS — R109 Unspecified abdominal pain: Secondary | ICD-10-CM | POA: Diagnosis not present

## 2018-08-11 DIAGNOSIS — K921 Melena: Secondary | ICD-10-CM | POA: Diagnosis not present

## 2018-08-11 MED ORDER — SOD PICOSULFATE-MAG OX-CIT ACD 10-3.5-12 MG-GM -GM/160ML PO SOLN: 1.0000 | ORAL | 0 refills | Status: DC

## 2018-08-11 NOTE — Patient Instructions (Signed)
If you are age 41 or older, your body mass index should be between 23-30. Your Body mass index is 26.3 kg/m. If this is out of the aforementioned range listed, please consider follow up with your Primary Care Provider.  If you are age 9 or younger, your body mass index should be between 19-25. Your Body mass index is 26.3 kg/m. If this is out of the aformentioned range listed, please consider follow up with your Primary Care Provider.   You have been scheduled for a colonoscopy. Please follow written instructions given to you at your visit today.  Please pick up your prep supplies at the pharmacy within the next 1-3 days. If you use inhalers (even only as needed), please bring them with you on the day of your procedure. Your physician has requested that you go to www.startemmi.com and enter the access code given to you at your visit today. This web site gives a general overview about your procedure. However, you should still follow specific instructions given to you by our office regarding your preparation for the procedure.  We have sent the following medications to your pharmacy for you to pick up at your convenience: Clenpiq    It was a pleasure to see you today!  Vito Cirigliano, D.O.

## 2018-08-11 NOTE — Progress Notes (Signed)
Chief Complaint: Colitis, abdominal pain, hematochezia   Referring Provider:     Silverio Decamp, MD    HPI:     Peter Knight is a 42 y.o. male referred to the [facility]astro[facility]ology Clinic for evaluation of hematochezia, abdominal pain and prior hx of colitis.   He states he has had intermittent GI sxs for years, described as lower abdominal pain, typically occurring at night. Has associated BRBPR with these episodes. Can have tenesmus with episodes. Sxs last 1 day or so then returns to baseline state of health. Sxs abrupt in onset and without preceding sxs and no preceding Abx, hospitalization, travel, etc. Has occurred 7 times or so over the last 7 years, including index event in 2012.  At that time was seen in the ER and evaluation notable for leukocytosis and CT demonstrating colitis in the mid transverse to descending colon.  Was treated with antibiotics with resolution of symptoms.  No imaging since then for review.  He has had 2 episodes this year; 6 months ago and 3 weeks ago.   Baseline 1 BM/day.  Today, he states he is back to his baseline state of health.  Tolerating all p.o. intake.  No associated upper GI symptoms with his episodes.  No previous EGD or colonoscopy.  Family history notable for father with diverticulitis requiring segmental resection, but otherwise no Crohn's disease, ulcerative colitis, CRC, GI malignancy, liver or pancreaticobiliary disease.  Recent labs n/f: negative O&P, stool cx, C diff, fecal lactoferrin, S cervisiae Ab, lipase, TSH, TTG, ANCA, and normal CBC, CMP, ESR  History reviewed. No pertinent past medical history.   History reviewed. No pertinent surgical history. Family History  Problem Relation Age of Onset  . Prostate cancer Father   . Prostate cancer Paternal Uncle        said he just had his prostate removed  . Colon cancer Neg Hx   . Esophageal cancer Neg Hx    Social History   Tobacco Use  . Smoking status:  Never Smoker  . Smokeless tobacco: Never Used  Substance Use Topics  . Alcohol use: Yes    Frequency: Never  . Drug use: Never   Current Outpatient Medications  Medication Sig Dispense Refill  . amoxicillin-clavulanate (AUGMENTIN) 875-125 MG tablet Take 1 tablet by mouth 2 (two) times daily.    Marland Kitchen atorvastatin (LIPITOR) 20 MG tablet Take 1 tablet (20 mg total) by mouth daily. 90 tablet 3  . fluticasone (FLONASE) 50 MCG/ACT nasal spray Place 1 spray into the nose daily.    . traZODone (DESYREL) 100 MG tablet TAKE 1 TABLET (100 MG TOTAL) BY MOUTH AT BEDTIME. 90 tablet 0  . Sod Picosulfate-Mag Ox-Cit Acd (CLENPIQ) 10-3.5-12 MG-GM -GM/160ML SOLN Take 1 kit by mouth as directed. 2 Bottle 0   No current facility-administered medications for this visit.    Allergies  Allergen Reactions  . Naproxen Other (See Comments)    Low abdominal pain     Review of Systems: All systems reviewed and negative except where noted in HPI.     Physical Exam:    Wt Readings from Last 3 Encounters:  08/11/18 173 lb (78.5 kg)  07/27/18 171 lb (77.6 kg)  02/12/18 170 lb (77.1 kg)    BP 126/72   Pulse 77   Ht '5\' 8"'$  (1.727 m)   Wt 173 lb (78.5 kg)   BMI 26.30 kg/m  Constitutional:  Pleasant,  in no acute distress. Psychiatric: Normal mood and affect. Behavior is normal. EENT: Pupils normal.  Conjunctivae are normal. No scleral icterus. Neck supple. No cervical LAD. Cardiovascular: Normal rate, regular rhythm. No edema Pulmonary/chest: Effort normal and breath sounds normal. No wheezing, rales or rhonchi. Abdominal: Soft, nondistended.  Minimal TTP in LLQ.  No rebound or guarding.  No peritoneal signs.  Bowel sounds active throughout. There are no masses palpable. No hepatomegaly. Neurological: Alert and oriented to person place and time. Skin: Skin is warm and dry. No rashes noted.   ASSESSMENT AND PLAN;   KAYAN BLISSETT is a 42 y.o. male presenting with:  1) Hematochezia: Abrupt onset  lower abdominal pain and self-limiting hematochezia that has occurred episodically for the last 7 years or so.  Otherwise feels fine in between episodes.  Discussed the broad DDX to include IBD, diverticular associated colitis, mild self-limiting recurrent diverticulitis, ulcer, hemorrhoids, etc. and will evaluate as below:  - Colonoscopy to evaluate for mucosal luminal etiology for presenting symptoms - Recent labs otherwise unremarkable, to include infectious etiology, inflammatory markers.  No new labs needed at this time -As he is currently asymptomatic, no plan for instituting new medications at this time -Resume high-fiber diet -RTC after colonoscopy  The indications, risks, and benefits of colonoscopy were explained to the patient in detail. Risks include but are not limited to bleeding, perforation, adverse reaction to medications, and cardiopulmonary compromise. Sequelae include but are not limited to the possibility of surgery, hospitalization, and mortality. The patient verbalized understanding and wished to proceed. All questions answered, referred to the scheduler and bowel prep ordered. Further recommendations pending results of the exam.    Peter Bullion, DO, FACG  08/11/2018, 10:36 AM   Silverio Decamp,*

## 2018-08-16 ENCOUNTER — Telehealth: Payer: Self-pay | Admitting: Gastroenterology

## 2018-08-16 NOTE — Telephone Encounter (Signed)
Pt states prep is 159.00- even with  the $40 coupon, too expensive.  Will give Clenpiq sample-- Lot [id number]   Exp 08-2018  As Directed- pt states he or his wife will pick up sample between Tuesday and Thursday this week   Pt asked about prep instructions- he was under the impression he could eat all day Sunday , take 1st bottle in the morning Sunday and then eat all day and do the 2nd bottle Monday morning with Liquids only.  We discussed prep instructions for Sunday to include CLEAR LIQUIDS ONLY--  WHEN TO DO BOTTLE 1 AT 6 PM AND THEN CLEARS ONLY-- Monday CLEARS LIQUIDS 2ND PREP AT 10 AM .  NO LIQUIDS AT ALL AFTER 12 NOON. I encouraged him to read his instructions and call with ANY questions . Pt verbalized understanding of what we discussed

## 2018-08-23 ENCOUNTER — Encounter: Payer: BLUE CROSS/BLUE SHIELD | Admitting: Gastroenterology

## 2018-08-25 ENCOUNTER — Encounter: Payer: BLUE CROSS/BLUE SHIELD | Admitting: Gastroenterology

## 2018-08-28 ENCOUNTER — Other Ambulatory Visit: Payer: Self-pay | Admitting: Sports Medicine

## 2018-09-07 ENCOUNTER — Ambulatory Visit (AMBULATORY_SURGERY_CENTER): Payer: BLUE CROSS/BLUE SHIELD | Admitting: Gastroenterology

## 2018-09-07 ENCOUNTER — Encounter: Payer: Self-pay | Admitting: Gastroenterology

## 2018-09-07 ENCOUNTER — Encounter: Payer: BLUE CROSS/BLUE SHIELD | Admitting: Gastroenterology

## 2018-09-07 VITALS — BP 117/76 | HR 64 | Temp 98.6°F | Resp 11 | Ht 68.0 in | Wt 173.0 lb

## 2018-09-07 DIAGNOSIS — K921 Melena: Secondary | ICD-10-CM

## 2018-09-07 DIAGNOSIS — K640 First degree hemorrhoids: Secondary | ICD-10-CM | POA: Diagnosis not present

## 2018-09-07 DIAGNOSIS — R109 Unspecified abdominal pain: Secondary | ICD-10-CM

## 2018-09-07 DIAGNOSIS — K5730 Diverticulosis of large intestine without perforation or abscess without bleeding: Secondary | ICD-10-CM | POA: Diagnosis not present

## 2018-09-07 DIAGNOSIS — R1030 Lower abdominal pain, unspecified: Secondary | ICD-10-CM | POA: Diagnosis not present

## 2018-09-07 MED ORDER — SODIUM CHLORIDE 0.9 % IV SOLN: 500.0000 mL | Freq: Once | INTRAVENOUS | Status: DC

## 2018-09-07 NOTE — Progress Notes (Signed)
Pt's states no medical or surgical changes since previsit or office visit. 

## 2018-09-07 NOTE — Patient Instructions (Signed)
   INFORMATION ON DIVERTICULOSIS & HEMORRHOIDS GIVEN TO YOU TODAY   USE FIBER SUCH AS METAMUCIL , FIBERCON,OR CITRACEL AS DIRECTED    YOU HAD AN ENDOSCOPIC PROCEDURE TODAY AT [facility]:   Refer to the procedure report that was given to you for any specific questions about what was found during the examination.  If the procedure report does not answer your questions, please call your gastroenterologist to clarify.  If you requested that your care partner not be given the details of your procedure findings, then the procedure report has been included in a sealed envelope for you to review at your convenience later.  YOU SHOULD EXPECT: Some feelings of bloating in the abdomen. Passage of more gas than usual.  Walking can help get rid of the air that was put into your GI tract during the procedure and reduce the bloating. If you had a lower endoscopy (such as a colonoscopy or flexible sigmoidoscopy) you may notice spotting of blood in your stool or on the toilet paper. If you underwent a bowel prep for your procedure, you may not have a normal bowel movement for a few days.  Please Note:  You might notice some irritation and congestion in your nose or some drainage.  This is from the oxygen used during your procedure.  There is no need for concern and it should clear up in a day or so.  SYMPTOMS TO REPORT IMMEDIATELY:   Following lower endoscopy (colonoscopy or flexible sigmoidoscopy):  Excessive amounts of blood in the stool  Significant tenderness or worsening of abdominal pains  Swelling of the abdomen that is new, acute  Fever of 100F or higher    For urgent or emergent issues, a gastroenterologist can be reached at any hour by calling [phone].   DIET:  We do recommend a small meal at first, but then you may proceed to your regular diet.  Drink plenty of fluids but you should avoid alcoholic beverages for 24 hours.  ACTIVITY:  You should plan to take it easy  for the rest of today and you should NOT DRIVE or use heavy machinery until tomorrow (because of the sedation medicines used during the test).    FOLLOW UP: Our staff will call the number listed on your records the next business day following your procedure to check on you and address any questions or concerns that you may have regarding the information given to you following your procedure. If we do not reach you, we will leave a message.  However, if you are feeling well and you are not experiencing any problems, there is no need to return our call.  We will assume that you have returned to your regular daily activities without incident.  If any biopsies were taken you will be contacted by phone or by letter within the next 1-3 weeks.  Please call us at [phone] if you have not heard about the biopsies in 3 weeks.    SIGNATURES/CONFIDENTIALITY: You and/or your care partner have signed paperwork which will be entered into your electronic medical record.  These signatures attest to the fact that that the information above on your After Visit Summary has been reviewed and is understood.  Full responsibility of the confidentiality of this discharge information lies with you and/or your care-partner.

## 2018-09-07 NOTE — Progress Notes (Signed)
Spontaneous respirations throughout. VSS. Resting comfortably. To PACU on room air. Report to  RN. 

## 2018-09-07 NOTE — Op Note (Addendum)
[facility] Patient Name: Peter Knight Procedure Date: 09/07/2018 11:09 AM MRN: [id number] Endoscopist: Gerrit Heck , MD Age: 42 Referring MD:  Date of Birth: 1976-01-29 Gender: Male Account #: [id number] Procedure:                Colonoscopy Indications:              Lower abdominal pain, Hematochezia Medicines:                Monitored Anesthesia Care Procedure:                Pre-Anesthesia Assessment:                           - Prior to the procedure, a History and Physical                            was performed, and patient medications and                            allergies were reviewed. The patient's tolerance of                            previous anesthesia was also reviewed. The risks                            and benefits of the procedure and the sedation                            options and risks were discussed with the patient.                            All questions were answered, and informed consent                            was obtained. Prior Anticoagulants: The patient has                            taken no previous anticoagulant or antiplatelet                            agents. ASA Grade Assessment: II - A patient with                            mild systemic disease. After reviewing the risks                            and benefits, the patient was deemed in                            satisfactory condition to undergo the procedure.                           After obtaining informed consent, the colonoscope  was passed under direct vision. Throughout the                            procedure, the patient's blood pressure, pulse, and                            oxygen saturations were monitored continuously. The                            Colonoscope was introduced through the anus and                            advanced to the the cecum, identified by                            appendiceal orifice and ileocecal  valve. The                            colonoscopy was performed without difficulty. The                            patient tolerated the procedure well. The quality                            of the bowel preparation was adequate. Scope In: 11:11:15 AM Scope Out: 11:28:17 AM Scope Withdrawal Time: 0 hours 11 minutes 29 seconds  Total Procedure Duration: 0 hours 17 minutes 2 seconds  Findings:                 The perianal and digital rectal examinations were                            normal.                           A few small-mouthed diverticula were found in the                            sigmoid colon.                           Non-bleeding internal hemorrhoids were found during                            retroflexion. The hemorrhoids were small.                           The exam was otherwise normal throughout the                            examined colon. Complications:            No immediate complications. Estimated Blood Loss:     Estimated blood loss was minimal. Estimated blood  loss: none. Impression:               - Diverticulosis in the sigmoid colon.                           - Non-bleeding internal hemorrhoids.                           - No specimens collected. Recommendation:           - Patient has a contact number available for                            emergencies. The signs and symptoms of potential                            delayed complications were discussed with the                            patient. Return to normal activities tomorrow.                            Written discharge instructions were provided to the                            patient.                           - Resume previous diet.                           - Continue present medications.                           - Repeat colonoscopy at age 18 for screening                            purposes.                           - Return to GI clinic PRN.                            - Use fiber, for example Citrucel, Fibercon, Konsyl                            or Metamucil. Gerrit Heck, MD 09/07/2018 11:31:51 AM

## 2018-09-08 ENCOUNTER — Telehealth: Payer: Self-pay

## 2018-09-08 NOTE — Telephone Encounter (Signed)
  Follow up Call-  Call back number 09/07/2018  Post procedure Call Back phone  # [phone]  Permission to leave phone message Yes  Some recent data might be hidden     Patient questions:  Do you have a fever, pain , or abdominal swelling? No. Pain Score  0 *  Have you tolerated food without any problems? Yes.    Have you been able to return to your normal activities? Yes.    Do you have any questions about your discharge instructions: Diet   No. Medications  No. Follow up visit  No.  Do you have questions or concerns about your Care? No.  Actions: * If pain score is 4 or above: No action needed, pain <4.

## 2018-10-27 ENCOUNTER — Encounter: Payer: Self-pay | Admitting: Sports Medicine

## 2018-10-27 ENCOUNTER — Ambulatory Visit (INDEPENDENT_AMBULATORY_CARE_PROVIDER_SITE_OTHER): Payer: BLUE CROSS/BLUE SHIELD | Admitting: Sports Medicine

## 2018-10-27 DIAGNOSIS — E785 Hyperlipidemia, unspecified: Secondary | ICD-10-CM | POA: Diagnosis not present

## 2018-10-27 DIAGNOSIS — G4733 Obstructive sleep apnea (adult) (pediatric): Secondary | ICD-10-CM

## 2018-10-27 DIAGNOSIS — R011 Cardiac murmur, unspecified: Secondary | ICD-10-CM | POA: Diagnosis not present

## 2018-10-27 DIAGNOSIS — K529 Noninfective gastroenteritis and colitis, unspecified: Secondary | ICD-10-CM | POA: Diagnosis not present

## 2018-10-27 MED ORDER — TRAZODONE HCL 150 MG PO TABS: 150.0000 mg | ORAL_TABLET | Freq: Every day | ORAL | 3 refills | Status: DC

## 2018-10-27 NOTE — Assessment & Plan Note (Signed)
Colonoscopy looked good. Further evaluation with gastroenterology.

## 2018-10-27 NOTE — Assessment & Plan Note (Signed)
Has been inconsistent with his Lipitor. He will do this consistently and we can recheck in 2 months.

## 2018-10-27 NOTE — Assessment & Plan Note (Signed)
Echocardiogram is normal, trace multi valvular insufficiency that needs no further intervention or evaluation.

## 2018-10-27 NOTE — Assessment & Plan Note (Signed)
Continue trazodone. Wakes up at approximately 4 AM. Increasing to 150 mg nightly, reevaluate through my chart in 1 month. He will discontinue his Unisom, antihistamines can interrupt sleep architecture.

## 2018-10-27 NOTE — Progress Notes (Signed)
Subjective:    CC: Follow-up  HPI: Insomnia: Partial response to trazodone 100, taking some antihistamines as well.  Colitis: Stable, is under the care of gastroenterology.  Hyperlipidemia: Has been inconsistent with his Lipitor use, no side effects.  I reviewed the past medical history, family history, social history, surgical history, and allergies today and no changes were needed.  Please see the problem list section below in epic for further details.  Past Medical History: No past medical history on file. Past Surgical History: No past surgical history on file. Social History: Social History   Socioeconomic History  . Marital status: Married    Spouse name: Not on file  . Number of children: 3  . Years of education: Not on file  . Highest education level: Not on file  Occupational History  . Occupation: Independent Biochemist, clinical   Social Needs  . Financial resource strain: Not on file  . Food insecurity:    Worry: Not on file    Inability: Not on file  . Transportation needs:    Medical: Not on file    Non-medical: Not on file  Tobacco Use  . Smoking status: Never Smoker  . Smokeless tobacco: Never Used  Substance and Sexual Activity  . Alcohol use: Yes    Frequency: Never  . Drug use: Never  . Sexual activity: Not on file  Lifestyle  . Physical activity:    Days per week: Not on file    Minutes per session: Not on file  . Stress: Not on file  Relationships  . Social connections:    Talks on phone: Not on file    Gets together: Not on file    Attends religious service: Not on file    Active member of club or organization: Not on file    Attends meetings of clubs or organizations: Not on file    Relationship status: Not on file  Other Topics Concern  . Not on file  Social History Narrative  . Not on file   Family History: Family History  Problem Relation Age of Onset  . Prostate cancer Father   . Prostate cancer Paternal Uncle        said he just  had his prostate removed  . Colon cancer Neg Hx   . Esophageal cancer Neg Hx    Allergies: Allergies  Allergen Reactions  . Naproxen Other (See Comments)    Low abdominal pain   Medications: See med rec.  Review of Systems: No fevers, chills, night sweats, weight loss, chest pain, or shortness of breath.   Objective:    General: Well Developed, well nourished, and in no acute distress.  Neuro: Alert and oriented x3, extra-ocular muscles intact, sensation grossly intact.  HEENT: Normocephalic, atraumatic, pupils equal round reactive to light, neck supple, no masses, no lymphadenopathy, thyroid nonpalpable.  Skin: Warm and dry, no rashes. Cardiac: Regular rate and rhythm, no murmurs rubs or gallops, no lower extremity edema.  Respiratory: Clear to auscultation bilaterally. Not using accessory muscles, speaking in full sentences.  Impression and Recommendations:    Systolic murmur Echocardiogram is normal, trace multi valvular insufficiency that needs no further intervention or evaluation.  Hyperlipidemia Has been inconsistent with his Lipitor. He will do this consistently and we can recheck in 2 months.  Colitis Colonoscopy looked good. Further evaluation with gastroenterology.  Obstructive sleep apnea Continue trazodone. Wakes up at approximately 4 AM. Increasing to 150 mg nightly, reevaluate through my chart in 1 month. He  will discontinue his Unisom, antihistamines can interrupt sleep architecture. ___________________________________________ Gwen Her. Dianah Field, M.D., ABFM., CAQSM. Primary Care and Sports Medicine [facility]  Adjunct Professor of [place] [employer] [facility] of Medicine

## 2018-11-27 ENCOUNTER — Other Ambulatory Visit: Payer: Self-pay | Admitting: Sports Medicine

## 2019-03-04 ENCOUNTER — Encounter: Payer: Self-pay | Admitting: Sports Medicine

## 2019-03-05 NOTE — Telephone Encounter (Signed)
Please set him up with a time to do a telephone call or doximity visit Monday please.

## 2019-03-07 ENCOUNTER — Encounter: Payer: Self-pay | Admitting: Sports Medicine

## 2019-03-07 NOTE — Telephone Encounter (Signed)
He states the insurance plan he now has Dr Dianah Field is out of network. He states he can't afford to see Dr Dianah Field out of network. He will come back to see him when he changes his plan.

## 2019-03-09 ENCOUNTER — Encounter: Payer: Self-pay | Admitting: Sports Medicine

## 2019-03-09 DIAGNOSIS — N529 Male erectile dysfunction, unspecified: Secondary | ICD-10-CM | POA: Insufficient documentation

## 2019-03-09 MED ORDER — SILDENAFIL CITRATE 20 MG PO TABS: 20.0000 mg | ORAL_TABLET | ORAL | 11 refills | Status: DC | PRN

## 2019-03-09 NOTE — Assessment & Plan Note (Signed)
Calling in generic Viagra.

## 2019-07-25 ENCOUNTER — Other Ambulatory Visit: Payer: Self-pay | Admitting: *Deleted

## 2019-07-25 DIAGNOSIS — E785 Hyperlipidemia, unspecified: Secondary | ICD-10-CM

## 2019-07-25 MED ORDER — ATORVASTATIN CALCIUM 20 MG PO TABS: 20.0000 mg | ORAL_TABLET | Freq: Every day | ORAL | 0 refills | Status: DC

## 2019-10-21 ENCOUNTER — Other Ambulatory Visit: Payer: Self-pay | Admitting: Sports Medicine

## 2019-10-21 DIAGNOSIS — G4733 Obstructive sleep apnea (adult) (pediatric): Secondary | ICD-10-CM

## 2020-10-24 ENCOUNTER — Other Ambulatory Visit: Payer: Self-pay | Admitting: Sports Medicine

## 2020-10-24 DIAGNOSIS — G4733 Obstructive sleep apnea (adult) (pediatric): Secondary | ICD-10-CM

## 2020-10-26 ENCOUNTER — Encounter: Payer: Self-pay | Admitting: Emergency Medicine

## 2020-10-26 ENCOUNTER — Emergency Department (INDEPENDENT_AMBULATORY_CARE_PROVIDER_SITE_OTHER)
Admission: EM | Admit: 2020-10-26 | Discharge: 2020-10-26 | Disposition: A | Discharge status: Home / Self Care | Payer: BLUE CROSS/BLUE SHIELD | Source: Home / Self Care | Attending: Family Medicine | Admitting: Family Medicine

## 2020-10-26 ENCOUNTER — Other Ambulatory Visit: Payer: Self-pay

## 2020-10-26 DIAGNOSIS — G4733 Obstructive sleep apnea (adult) (pediatric): Secondary | ICD-10-CM

## 2020-10-26 DIAGNOSIS — J0101 Acute recurrent maxillary sinusitis: Secondary | ICD-10-CM

## 2020-10-26 MED ORDER — AMOXICILLIN 875 MG PO TABS: 875.0000 mg | ORAL_TABLET | Freq: Two times a day (BID) | ORAL | 0 refills | Status: DC

## 2020-10-26 MED ORDER — TRAZODONE HCL 150 MG PO TABS: 150.0000 mg | ORAL_TABLET | Freq: Every day | ORAL | 0 refills | Status: DC

## 2020-10-26 NOTE — ED Provider Notes (Signed)
[name]    CSN: [id number] Arrival date & time: 10/26/20  1349      History   Chief Complaint Chief Complaint  Patient presents with  . Nasal Congestion    HPI Peter Knight is a 45 y.o. male.   HPI   Patient states he is COVID vaccinated.  In spite of this he had COVID 3 weeks ago.  He had minor symptoms.  Ever since that time, however, he has been having runny stuffy nose and sinus pressure.  In the last few days the sinus drainage has become brown and blood-streaked, more purulent.  He has face pressure and pain.  Slight sore throat.  Slight cough He does not have any fever or chills, headache or body aches, decreased appetite He also requests a refill of his trazodone until he can get in with his usual primary care doctor.  He has not seen him for over a year.  I explained to the patient that it is very important to see his primary care doctor at least once a year to continue to obtain care from that doctor and prescriptions.  History reviewed. No pertinent past medical history.  Patient Active Problem List   Diagnosis Date Noted  . Erectile dysfunction 03/09/2019  . Colitis 02/12/2018  . Hyperlipidemia 09/05/2016  . Systolic murmur 55/73/2202  . Obstructive sleep apnea 09/02/2016  . Mass of skin 09/02/2016  . Annual physical exam 09/02/2016  . Anxiety 09/02/2016    History reviewed. No pertinent surgical history.     Home Medications    Prior to Admission medications   Medication Sig Start Date End Date Taking? Authorizing Provider  amoxicillin (AMOXIL) 875 MG tablet Take 1 tablet (875 mg total) by mouth 2 (two) times daily. 10/26/20  Yes Raylene Everts, MD  guaiFENesin (MUCINEX) 600 MG 12 hr tablet Take by mouth 2 (two) times daily.   Yes [provider]  atorvastatin (LIPITOR) 20 MG tablet Take 1 tablet (20 mg total) by mouth daily. Needs fasting lab work for future refills. 07/25/19   Silverio Decamp, MD  doxylamine, Sleep,  (UNISOM) 25 MG tablet Take 25 mg by mouth at bedtime as needed.    [provider]  fluticasone (FLONASE) 50 MCG/ACT nasal spray Place 1 spray into the nose daily.    [provider]  sildenafil (REVATIO) 20 MG tablet Take 1-5 tablets (20-100 mg total) by mouth as needed. 03/09/19   Silverio Decamp, MD  traZODone (DESYREL) 150 MG tablet Take 1 tablet (150 mg total) by mouth at bedtime. 10/26/20   Raylene Everts, MD    Family History Family History  Problem Relation Age of Onset  . Prostate cancer Father   . Prostate cancer Paternal Uncle        said he just had his prostate removed  . Colon cancer Neg Hx   . Esophageal cancer Neg Hx     Social History Social History   Tobacco Use  . Smoking status: Never Smoker  . Smokeless tobacco: Never Used  Vaping Use  . Vaping Use: Never used  Substance Use Topics  . Alcohol use: Yes  . Drug use: Never     Allergies   Naproxen   Review of Systems Review of Systems See HPI  Physical Exam Triage Vital Signs ED Triage Vitals  Enc Vitals Group     BP 10/26/20 1442 (!) 146/86     Pulse Rate 10/26/20 1442 87  Resp 10/26/20 1442 18     Temp 10/26/20 1442 98.4 F (36.9 C)     Temp Source 10/26/20 1442 Oral     SpO2 10/26/20 1442 98 %     Weight 10/26/20 1443 170 lb (77.1 kg)     Height 10/26/20 1443 5\' 8"  (1.727 m)     Head Circumference --      Peak Flow --      Pain Score 10/26/20 1442 1     Pain Loc --      Pain Edu? --      Excl. in [place]? --    No data found.  Updated Vital Signs BP (!) 146/86 (BP Location: Right Arm)   Pulse 87   Temp 98.4 F (36.9 C) (Oral)   Resp 18   Ht 5\' 8"  (1.727 m)   Wt 77.1 kg   SpO2 98%   BMI 25.85 kg/m      Physical Exam Constitutional:      General: He is not in acute distress.    Appearance: He is well-developed and well-nourished.  HENT:     Head: Normocephalic and atraumatic.     Right Ear: There is impacted cerumen.     Left Ear: There is  impacted cerumen.     Nose: Congestion and rhinorrhea present.     Mouth/Throat:     Pharynx: Posterior oropharyngeal erythema present.  Eyes:     Conjunctiva/sclera: Conjunctivae normal.     Pupils: Pupils are equal, round, and reactive to light.  Cardiovascular:     Rate and Rhythm: Normal rate and regular rhythm.     Heart sounds: Normal heart sounds.  Pulmonary:     Effort: Pulmonary effort is normal. No respiratory distress.     Breath sounds: Normal breath sounds.  Abdominal:     General: There is no distension.     Palpations: Abdomen is soft.  Musculoskeletal:        General: No edema. Normal range of motion.     Cervical back: Normal range of motion.  Lymphadenopathy:     Cervical: No cervical adenopathy.  Skin:    General: Skin is warm and dry.  Neurological:     Mental Status: He is alert and oriented to person, place, and time.  Psychiatric:        Behavior: Behavior normal.      UC Treatments / Results  Labs (all labs ordered are listed, but only abnormal results are displayed) Labs Reviewed - No data to display  EKG   Radiology No results found.  Procedures Procedures (including critical care time)  Medications Ordered in UC Medications - No data to display  Initial Impression / Assessment and Plan / UC Course  I have reviewed the triage vital signs and the nursing notes.  Pertinent labs & imaging results that were available during my care of the patient were reviewed by me and considered in my medical decision making (see chart for details).     With 3 weeks of symptoms and change in status of sinus drainage, will offer patient an antibiotic.  Continue Flonase.  I will refill  trazodone 1 time.  Must see PCP in follow-up Final Clinical Impressions(s) / UC Diagnoses   Final diagnoses:  Acute recurrent maxillary sinusitis     Discharge Instructions     Continue Flonase daily Continue Mucinex to loosen the mucus Drink lots of water I  gave you one refill of trazodone Please call and make  an appointment with your primary care doctor before this expires   ED Prescriptions    Medication Sig Dispense Auth. Provider   traZODone (DESYREL) 150 MG tablet Take 1 tablet (150 mg total) by mouth at bedtime. 90 tablet Raylene Everts, MD   amoxicillin (AMOXIL) 875 MG tablet Take 1 tablet (875 mg total) by mouth 2 (two) times daily. 14 tablet Raylene Everts, MD     PDMP not reviewed this encounter.   Raylene Everts, MD 10/26/20 1535

## 2020-10-26 NOTE — Discharge Instructions (Addendum)
Continue Flonase daily Continue Mucinex to loosen the mucus Drink lots of water I gave you one refill of trazodone Please call and make an appointment with your primary care doctor before this expires

## 2020-10-26 NOTE — ED Triage Notes (Signed)
Nasal congestion, pressure behind eyes, cough, had Covid 3 weeks ago.

## 2021-02-11 ENCOUNTER — Other Ambulatory Visit: Payer: Self-pay

## 2021-02-11 ENCOUNTER — Ambulatory Visit (INDEPENDENT_AMBULATORY_CARE_PROVIDER_SITE_OTHER): Payer: 59 | Admitting: Sports Medicine

## 2021-02-11 DIAGNOSIS — E785 Hyperlipidemia, unspecified: Secondary | ICD-10-CM | POA: Diagnosis not present

## 2021-02-11 DIAGNOSIS — F5101 Primary insomnia: Secondary | ICD-10-CM

## 2021-02-11 DIAGNOSIS — N529 Male erectile dysfunction, unspecified: Secondary | ICD-10-CM

## 2021-02-11 DIAGNOSIS — Z0000 Encounter for general adult medical examination without abnormal findings: Secondary | ICD-10-CM

## 2021-02-11 DIAGNOSIS — G4700 Insomnia, unspecified: Secondary | ICD-10-CM | POA: Insufficient documentation

## 2021-02-11 MED ORDER — TADALAFIL 5 MG PO TABS: 5.0000 mg | ORAL_TABLET | Freq: Every day | ORAL | 11 refills | Status: DC

## 2021-02-11 MED ORDER — TRAZODONE HCL 300 MG PO TABS: 300.0000 mg | ORAL_TABLET | Freq: Every day | ORAL | 3 refills | Status: DC

## 2021-02-11 NOTE — Assessment & Plan Note (Signed)
Annual physical as above, checking routine labs. 

## 2021-02-11 NOTE — Assessment & Plan Note (Signed)
Peter Knight has very mild insomnia, he will typically wake up halfway through the night. He was started on trazodone 150 by the urgent care provider which seems to help to some degree. We went through his sleep hygiene, he could decrease his screen time before bed, I showed him how to activate a bluelight filter on his phone, we will go ahead and increase trazodone to 300 mg nightly and he will decrease screen time. If insufficient improvement over the next few months we will consider switching to Ambien.

## 2021-02-11 NOTE — Assessment & Plan Note (Addendum)
Switching to Cialis. Checking testosterone levels, I did explain to [place] that testosterone levels did not correlate to erectile function.

## 2021-02-11 NOTE — Assessment & Plan Note (Signed)
Checking labs. He will return fasting.

## 2021-02-11 NOTE — Progress Notes (Signed)
Subjective:    CC: Annual Physical Exam  HPI:  This patient is here for their annual physical  I reviewed the past medical history, family history, social history, surgical history, and allergies today and no changes were needed.  Please see the problem list section below in epic for further details.  Past Medical History: No past medical history on file. Past Surgical History: No past surgical history on file. Social History: Social History   Socioeconomic History  . Marital status: Married    Spouse name: Not on file  . Number of children: 3  . Years of education: Not on file  . Highest education level: Not on file  Occupational History  . Occupation: Independent Biochemist, clinical   Tobacco Use  . Smoking status: Never Smoker  . Smokeless tobacco: Never Used  Vaping Use  . Vaping Use: Never used  Substance and Sexual Activity  . Alcohol use: Yes  . Drug use: Never  . Sexual activity: Not on file  Other Topics Concern  . Not on file  Social History Narrative  . Not on file   Social Determinants of Health   Financial Resource Strain: Not on file  Food Insecurity: Not on file  Transportation Needs: Not on file  Physical Activity: Not on file  Stress: Not on file  Social Connections: Not on file   Family History: Family History  Problem Relation Age of Onset  . Prostate cancer Father   . Prostate cancer Paternal Uncle        said he just had his prostate removed  . Colon cancer Neg Hx   . Esophageal cancer Neg Hx    Allergies: Allergies  Allergen Reactions  . Naproxen Other (See Comments)    Low abdominal pain   Medications: See med rec.  Review of Systems: No headache, visual changes, nausea, vomiting, diarrhea, constipation, dizziness, abdominal pain, skin rash, fevers, chills, night sweats, swollen lymph nodes, weight loss, chest pain, body aches, joint swelling, muscle aches, shortness of breath, mood changes, visual or auditory  hallucinations.  Objective:    General: Well Developed, well nourished, and in no acute distress.  Neuro: Alert and oriented x3, extra-ocular muscles intact, sensation grossly intact. Cranial nerves II through XII are intact, motor, sensory, and coordinative functions are all intact. HEENT: Normocephalic, atraumatic, pupils equal round reactive to light, neck supple, no masses, no lymphadenopathy, thyroid nonpalpable. Oropharynx, nasopharynx, external ear canals are unremarkable. Skin: Warm and dry, no rashes noted.  Cardiac: Regular rate and rhythm, no murmurs rubs or gallops.  Respiratory: Clear to auscultation bilaterally. Not using accessory muscles, speaking in full sentences.  Abdominal: Soft, nontender, nondistended, positive bowel sounds, no masses, no organomegaly.  Musculoskeletal: Shoulder, elbow, wrist, hip, knee, ankle stable, and with full range of motion.  Impression and Recommendations:    The patient was counselled, risk factors were discussed, anticipatory guidance given.  Annual physical exam Annual physical as above, checking routine labs.   Erectile dysfunction Switching to Cialis. Checking testosterone levels, I did explain to [place] that testosterone levels did not correlate to erectile function.  Hyperlipidemia Checking labs. He will return fasting.  Insomnia Elonzo has very mild insomnia, he will typically wake up halfway through the night. He was started on trazodone 150 by the urgent care provider which seems to help to some degree. We went through his sleep hygiene, he could decrease his screen time before bed, I showed him how to activate a bluelight filter on his phone, we  will go ahead and increase trazodone to 300 mg nightly and he will decrease screen time. If insufficient improvement over the next few months we will consider switching to Ambien.   ___________________________________________ Gwen Her. Dianah Field, M.D., ABFM., CAQSM. Primary Care  and Sports Medicine [facility]  Adjunct Professor of [employer] [facility] [employer]

## 2021-02-15 DIAGNOSIS — F5101 Primary insomnia: Secondary | ICD-10-CM | POA: Diagnosis not present

## 2021-02-18 MED ORDER — TRAZODONE HCL 100 MG PO TABS: 300.0000 mg | ORAL_TABLET | Freq: Every day | ORAL | 3 refills | Status: DC

## 2021-02-18 NOTE — Telephone Encounter (Signed)
I spent 5 total minutes of online digital evaluation and management services. 

## 2021-03-19 ENCOUNTER — Encounter: Payer: Self-pay | Admitting: Gastroenterology

## 2021-03-21 ENCOUNTER — Telehealth: Payer: Self-pay | Admitting: Sports Medicine

## 2021-03-21 ENCOUNTER — Encounter: Payer: Self-pay | Admitting: Sports Medicine

## 2021-03-21 ENCOUNTER — Other Ambulatory Visit: Payer: Self-pay

## 2021-03-21 ENCOUNTER — Ambulatory Visit (INDEPENDENT_AMBULATORY_CARE_PROVIDER_SITE_OTHER): Payer: 59 | Admitting: Sports Medicine

## 2021-03-21 DIAGNOSIS — K5792 Diverticulitis of intestine, part unspecified, without perforation or abscess without bleeding: Secondary | ICD-10-CM | POA: Diagnosis not present

## 2021-03-21 MED ORDER — METRONIDAZOLE 500 MG PO TABS: 500.0000 mg | ORAL_TABLET | Freq: Two times a day (BID) | ORAL | 0 refills | Status: AC

## 2021-03-21 MED ORDER — CIPROFLOXACIN HCL 750 MG PO TABS: 750.0000 mg | ORAL_TABLET | Freq: Two times a day (BID) | ORAL | 0 refills | Status: AC

## 2021-03-21 NOTE — Telephone Encounter (Signed)
I have not sent that cholesterol medicine in since 2020, I sent in ciprofloxacin and metronidazole which are both antibiotics.

## 2021-03-21 NOTE — Telephone Encounter (Signed)
Patient advised of information below. AM 

## 2021-03-21 NOTE — Patient Instructions (Signed)
Diverticulitis  Diverticulitis is infection or inflammation of small pouches (diverticula) in the colon that form due to a condition called diverticulosis. Diverticula can trap stool (feces) and bacteria, causing infection and inflammation. Diverticulitis may cause severe stomach pain and diarrhea. It may lead to tissue damage in the colon that causes bleeding or blockage. The diverticula may also burst (rupture) and cause infected stool to enter other areas of the abdomen. What are the causes? This condition is caused by stool becoming trapped in the diverticula, which allows bacteria to grow in the diverticula. This leads to inflammation and infection. What increases the risk? You are more likely to develop this condition if you have diverticulosis. The risk increases if you:  Are overweight or obese.  Do not get enough exercise.  Drink alcohol.  Use tobacco products.  Eat a diet that has a lot of red meat such as beef, pork, or lamb.  Eat a diet that does not include enough fiber. High-fiber foods include fruits, vegetables, beans, nuts, and whole grains.  Are over 40 years of age. What are the signs or symptoms? Symptoms of this condition may include:  Pain and tenderness in the abdomen. The pain is normally located on the left side of the abdomen, but it may occur in other areas.  Fever and chills.  Nausea.  Vomiting.  Cramping.  Bloating.  Changes in bowel routines.  Blood in your stool. How is this diagnosed? This condition is diagnosed based on:  Your medical history.  A physical exam.  Tests to make sure there is nothing else causing your condition. These tests may include: ? Blood tests. ? Urine tests. ? CT scan of the abdomen. How is this treated? Most cases of this condition are mild and can be treated at home. Treatment may include:  Taking over-the-counter pain medicines.  Following a clear liquid diet.  Taking antibiotic medicines by  mouth.  Resting. More severe cases may need to be treated at a hospital. Treatment may include:  Not eating or drinking.  Taking prescription pain medicine.  Receiving antibiotic medicines through an IV.  Receiving fluids and nutrition through an IV.  Surgery. When your condition is under control, your health care provider may recommend that you have a colonoscopy. This is an exam to look at the entire large intestine. During the exam, a lubricated, bendable tube is inserted into the anus and then passed into the rectum, colon, and other parts of the large intestine. A colonoscopy can show how severe your diverticula are and whether something else may be causing your symptoms. Follow these instructions at home: Medicines  Take over-the-counter and prescription medicines only as told by your health care provider. These include fiber supplements, probiotics, and stool softeners.  If you were prescribed an antibiotic medicine, take it as told by your health care provider. Do not stop taking the antibiotic even if you start to feel better.  Ask your health care provider if the medicine prescribed to you requires you to avoid driving or using machinery. Eating and drinking  Follow a full liquid diet or another diet as directed by your health care provider.  After your symptoms improve, your health care provider may tell you to change your diet. He or she may recommend that you eat a diet that contains at least 25 grams (25 g) of fiber daily. Fiber makes it easier to pass stool. Healthy sources of fiber include: ? Berries. One cup contains 4-8 grams of fiber. ? Beans   or lentils. One-half cup contains 5-8 grams of fiber. ? Green vegetables. One cup contains 4 grams of fiber.  Avoid eating red meat.   General instructions  Do not use any products that contain nicotine or tobacco, such as cigarettes, e-cigarettes, and chewing tobacco. If you need help quitting, ask your health care  provider.  Exercise for at least 30 minutes, 3 times each week. You should exercise hard enough to raise your heart rate and break a sweat.  Keep all follow-up visits as told by your health care provider. This is important. You may need to have a colonoscopy. Contact a health care provider if:  Your pain does not improve.  Your bowel movements do not return to normal. Get help right away if:  Your pain gets worse.  Your symptoms do not get better with treatment.  Your symptoms suddenly get worse.  You have a fever.  You vomit more than one time.  You have stools that are bloody, black, or tarry. Summary  Diverticulitis is infection or inflammation of small pouches (diverticula) in the colon that form due to a condition called diverticulosis. Diverticula can trap stool (feces) and bacteria, causing infection and inflammation.  You are at higher risk for this condition if you have diverticulosis and you eat a diet that does not include enough fiber.  Most cases of this condition are mild and can be treated at home. More severe cases may need to be treated at a hospital.  When your condition is under control, your health care provider may recommend that you have an exam called a colonoscopy. This exam can show how severe your diverticula are and whether something else may be causing your symptoms.  Keep all follow-up visits as told by your health care provider. This is important. This information is not intended to replace advice given to you by your health care provider. Make sure you discuss any questions you have with your health care provider. Document Revised: 07/18/2019 Document Reviewed: 07/18/2019 Elsevier Patient Education  2021 Elsevier Inc.  

## 2021-03-21 NOTE — Assessment & Plan Note (Addendum)
Peter Knight is a pleasant 45 year old male, he has a history of diverticulosis, colonoscopy back in 2019, no evidence of colitis on the colonoscopy. More recently he developed acute left lower quadrant abdominal pain with diarrhea, he was seen in the [facility] ED, ultimately a CT scan (available in Care Everywhere) showed severe colonic wall thickening, pericolonic fluid, diverticulitis, no abscesses. He was treated with amoxicillin clavulanic acid. Today on exam he still has tenderness in the left lower quadrant but no guarding or rigidity.  Good bowel sounds. Partial improvement, I do think we need to switch him to Cipro and Flagyl, he will eat a low residue high-fiber diet, stay hydrated. He will keep his stools soft as well with Colace. Considering the severe colonic wall thickening I do think he needs to get back in with Dr. Bryan Lemma for consideration of repeat colonoscopy, potential biopsies to ensure this is not ulcerative colitis. Return to see me in 1 month.

## 2021-03-21 NOTE — Progress Notes (Signed)
    Procedures performed today:    None.  Independent interpretation of notes and tests performed by another provider:   None.  Brief History, Exam, Impression, and Recommendations:    Acute diverticulitis Peter Knight is a pleasant 45 year old male, he has a history of diverticulosis, colonoscopy back in 2019, no evidence of colitis on the colonoscopy. More recently he developed acute left lower quadrant abdominal pain with diarrhea, he was seen in the [facility] ED, ultimately a CT scan (available in Care Everywhere) showed severe colonic wall thickening, pericolonic fluid, diverticulitis, no abscesses. He was treated with amoxicillin clavulanic acid. Today on exam he still has tenderness in the left lower quadrant but no guarding or rigidity.  Good bowel sounds. Partial improvement, I do think we need to switch him to Cipro and Flagyl, he will eat a low residue high-fiber diet, stay hydrated. He will keep his stools soft as well with Colace. Considering the severe colonic wall thickening I do think he needs to get back in with Dr. Bryan Lemma for consideration of repeat colonoscopy, potential biopsies to ensure this is not ulcerative colitis. Return to see me in 1 month.    ___________________________________________ Gwen Her. Dianah Field, M.D., ABFM., CAQSM. Primary Care and [place] Instructor of [employer] [facility] [employer]

## 2021-03-21 NOTE — Telephone Encounter (Signed)
Patient came back in after his appointment and was a little bit confused about his medications that was sent in. He didn't know what this medication was for and wanted to know was this called in today and if so could it be sent to another pharmacy possibly. It was kind of pricey at the pharmacy that it was sent to. AM   atorvastatin (LIPITOR) 20 MG tablet

## 2021-03-29 DIAGNOSIS — F5101 Primary insomnia: Secondary | ICD-10-CM

## 2021-03-29 MED ORDER — TRAZODONE HCL 100 MG PO TABS: 300.0000 mg | ORAL_TABLET | Freq: Every day | ORAL | 0 refills | Status: DC

## 2021-04-04 DIAGNOSIS — K5792 Diverticulitis of intestine, part unspecified, without perforation or abscess without bleeding: Secondary | ICD-10-CM

## 2021-04-05 ENCOUNTER — Telehealth: Payer: Self-pay

## 2021-04-05 MED ORDER — METRONIDAZOLE 500 MG PO TABS: 500.0000 mg | ORAL_TABLET | Freq: Two times a day (BID) | ORAL | 0 refills | Status: AC

## 2021-04-05 MED ORDER — CIPROFLOXACIN HCL 750 MG PO TABS: 750.0000 mg | ORAL_TABLET | Freq: Two times a day (BID) | ORAL | 0 refills | Status: AC

## 2021-04-05 NOTE — Telephone Encounter (Signed)
Patient left VM stating that the diverticulitis seems to be flaring back up. He sent a mychart msg. Please respond to it.

## 2021-04-14 ENCOUNTER — Encounter (INDEPENDENT_AMBULATORY_CARE_PROVIDER_SITE_OTHER): Payer: 59

## 2021-04-14 DIAGNOSIS — K5792 Diverticulitis of intestine, part unspecified, without perforation or abscess without bleeding: Secondary | ICD-10-CM

## 2021-04-15 NOTE — Telephone Encounter (Signed)
I spent 5 total minutes of online digital evaluation and management services. 

## 2021-04-18 ENCOUNTER — Encounter (INDEPENDENT_AMBULATORY_CARE_PROVIDER_SITE_OTHER): Payer: 59

## 2021-04-18 DIAGNOSIS — K5792 Diverticulitis of intestine, part unspecified, without perforation or abscess without bleeding: Secondary | ICD-10-CM

## 2021-04-19 MED ORDER — SENNOSIDES-DOCUSATE SODIUM 8.6-50 MG PO TABS: 1.0000 | ORAL_TABLET | Freq: Two times a day (BID) | ORAL | 0 refills | Status: DC

## 2021-04-19 NOTE — Telephone Encounter (Signed)
I spent 5 total minutes of online digital evaluation and management services. 

## 2021-04-20 ENCOUNTER — Other Ambulatory Visit: Payer: Self-pay | Admitting: Sports Medicine

## 2021-04-20 DIAGNOSIS — F5101 Primary insomnia: Secondary | ICD-10-CM

## 2021-04-24 ENCOUNTER — Ambulatory Visit: Payer: 59 | Admitting: Sports Medicine

## 2021-04-30 ENCOUNTER — Encounter: Payer: Self-pay | Admitting: Gastroenterology

## 2021-04-30 ENCOUNTER — Other Ambulatory Visit (INDEPENDENT_AMBULATORY_CARE_PROVIDER_SITE_OTHER): Payer: 59

## 2021-04-30 ENCOUNTER — Ambulatory Visit (INDEPENDENT_AMBULATORY_CARE_PROVIDER_SITE_OTHER): Payer: 59 | Admitting: Gastroenterology

## 2021-04-30 ENCOUNTER — Other Ambulatory Visit: Payer: Self-pay

## 2021-04-30 VITALS — BP 112/80 | HR 80 | Ht 68.0 in | Wt 161.0 lb

## 2021-04-30 DIAGNOSIS — R194 Change in bowel habit: Secondary | ICD-10-CM | POA: Diagnosis not present

## 2021-04-30 DIAGNOSIS — K5732 Diverticulitis of large intestine without perforation or abscess without bleeding: Secondary | ICD-10-CM

## 2021-04-30 DIAGNOSIS — R300 Dysuria: Secondary | ICD-10-CM | POA: Diagnosis not present

## 2021-04-30 DIAGNOSIS — R1030 Lower abdominal pain, unspecified: Secondary | ICD-10-CM

## 2021-04-30 LAB — URINALYSIS, ROUTINE W REFLEX MICROSCOPIC
Bilirubin Urine: NEGATIVE
Hgb urine dipstick: NEGATIVE
Ketones, ur: NEGATIVE
Leukocytes,Ua: NEGATIVE
Nitrite: NEGATIVE
Specific Gravity, Urine: 1.015 (ref 1.000–1.030)
Total Protein, Urine: NEGATIVE
Urine Glucose: NEGATIVE
Urobilinogen, UA: 0.2 (ref 0.0–1.0)
pH: 6 (ref 5.0–8.0)

## 2021-04-30 MED ORDER — DICYCLOMINE HCL 10 MG PO CAPS: 10.0000 mg | ORAL_CAPSULE | Freq: Four times a day (QID) | ORAL | 3 refills | Status: DC | PRN

## 2021-04-30 NOTE — Patient Instructions (Addendum)
If you are age 44 or older, your body mass index should be between 23-30. Your Body mass index is 24.48 kg/m. If this is out of the aforementioned range listed, please consider follow up with your Primary Care Provider.  If you are age 59 or younger, your body mass index should be between 19-25. Your Body mass index is 24.48 kg/m. If this is out of the aformentioned range listed, please consider follow up with your Primary Care Provider.   __________________________________________________________  The [facility] providers would like to encourage you to use [facility] to communicate with providers for non-urgent requests or questions.  Due to long hold times on the telephone, sending your provider a message by [facility] may be a faster and more efficient way to get a response.  Please allow 48 business hours for a response.  Please remember that this is for non-urgent requests.   Your provider has requested that you go to the 2nd floor for lab work before leaving today.   We have sent the following medications to your pharmacy for you to pick up at your convenience:  Bentyl   It has been recommended to you by your physician that you have a(n) colonoscopy completed. Per your request, we did not schedule the procedure(s) today. Please contact our office at [phone] should you decide to have the procedure completed. You will be scheduled for a pre-visit and procedure at that time.

## 2021-04-30 NOTE — Progress Notes (Signed)
Chief Complaint: Abdominal pain, history of diverticulitis   Referring Provider:     Silverio Decamp, MD   HPI:     Peter Knight is a 45 y.o. male referred to the [facility] for evaluation of lower abdominal pain for the last 7+ weeks.  He was seen in the ER on 03/13/2021 for this issue.  WBC 10.2, normal H/H/PLT, AST/ALT 69/95 otherwise normal CMP.  CT abdomen/pelvis with extensive wall thickening/inflammation in the sigmoid without abscess.  He was diagnosed with diverticulitis, treated with Augmentin x10 days.  Was seen in f/u by Dr. Dianah Field on 6/2, and due to only partial relief with the Augmentin, he was changed to Cipro/Flagyl.  Today, he states he continues to have lower abdominal pain.  Has a long-standing history of "irregular stools" with intermittent constipation. Has been taking MOM, and more recently started taking Senokot. Now with loose stools.  Also with dysuria.  No nausea/vomiting.  Fhx n/f father with diverticulitis requiring segmental resection.   -Colonoscopy in 08/2018: Sigmoid diverticulosis, internal hemorrhoids, otherwise normal.  History reviewed. No pertinent past medical history.   History reviewed. No pertinent surgical history. Family History  Problem Relation Age of Onset  . Prostate cancer Father   . Diverticulitis Father   . Prostate cancer Paternal Uncle        said he just had his prostate removed  . Colon cancer Neg Hx   . Esophageal cancer Neg Hx   . Pancreatic cancer Neg Hx   . Stomach cancer Neg Hx    Social History   Tobacco Use  . Smoking status: Never  . Smokeless tobacco: Never  Vaping Use  . Vaping Use: Never used  Substance Use Topics  . Alcohol use: Yes  . Drug use: Never   Current Outpatient Medications  Medication Sig Dispense Refill  . senna-docusate (SENOKOT-S) 8.6-50 MG tablet Take 1 tablet by mouth 2 (two) times daily. Until stooling regularly 60 tablet 0  . traZODone  (DESYREL) 100 MG tablet TAKE 3 TABLETS (300 MG TOTAL) BY MOUTH AT BEDTIME. 90 tablet 1   No current facility-administered medications for this visit.   Allergies  Allergen Reactions  . Naproxen Other (See Comments)    Low abdominal pain     Review of Systems: All systems reviewed and negative except where noted in HPI.     Physical Exam:    Wt Readings from Last 3 Encounters:  04/30/21 161 lb (73 kg)  03/21/21 166 lb (75.3 kg)  10/26/20 170 lb (77.1 kg)    BP 112/80   Pulse 80   Ht 5\' 8"  (1.727 m)   Wt 161 lb (73 kg)   SpO2 98%   BMI 24.48 kg/m  Constitutional:  Pleasant, in no acute distress. Psychiatric: Normal mood and affect. Behavior is normal. EENT: Pupils normal.  Conjunctivae are normal. No scleral icterus. Neck supple. No cervical LAD. Cardiovascular: Normal rate, regular rhythm. No edema Pulmonary/chest: Effort normal and breath sounds normal. No wheezing, rales or rhonchi. Abdominal: Mild TTP in  b/l lower abdomen.  No rebound or guarding.  No peritoneal signs.  Soft, nondistended. Bowel sounds active throughout. There are no masses palpable. No hepatomegaly. Neurological: Alert and oriented to person place and time. Skin: Skin is warm and dry. No rashes noted.   ASSESSMENT AND PLAN;   1) Lower abdominal pain 2) Diverticulosis with recent diverticulitis 3) Change in bowel habits  4) Dysuria  - UA - Fecal calprotectin, GI PCR panel - Colonoscopy to evaluate for continued inflammation or possibly early SCAD.  Scheduled to be done 8+ weeks after ER presentation - Trial course of Bentyl - Increase water consumption -Reasonable to start probiotic  The indications, risks, and benefits of colonoscopy were explained to the patient in detail. Risks include but are not limited to bleeding, perforation, adverse reaction to medications, and cardiopulmonary compromise. Sequelae include but are not limited to the possibility of surgery, hospitalization, and  mortality. The patient verbalized understanding and wished to proceed. All questions answered, referred to the scheduler and bowel prep ordered. Further recommendations pending results of the exam.     Lavena Bullion, DO, FACG  04/30/2021, 9:59 AM   Silverio Decamp,*

## 2021-05-01 ENCOUNTER — Other Ambulatory Visit (INDEPENDENT_AMBULATORY_CARE_PROVIDER_SITE_OTHER): Payer: 59

## 2021-05-01 DIAGNOSIS — R1030 Lower abdominal pain, unspecified: Secondary | ICD-10-CM

## 2021-05-01 DIAGNOSIS — R194 Change in bowel habit: Secondary | ICD-10-CM

## 2021-05-01 DIAGNOSIS — K5732 Diverticulitis of large intestine without perforation or abscess without bleeding: Secondary | ICD-10-CM

## 2021-05-01 DIAGNOSIS — R300 Dysuria: Secondary | ICD-10-CM

## 2021-05-02 ENCOUNTER — Telehealth: Payer: Self-pay | Admitting: Gastroenterology

## 2021-05-02 DIAGNOSIS — R194 Change in bowel habit: Secondary | ICD-10-CM

## 2021-05-02 DIAGNOSIS — R1030 Lower abdominal pain, unspecified: Secondary | ICD-10-CM

## 2021-05-02 DIAGNOSIS — K5732 Diverticulitis of large intestine without perforation or abscess without bleeding: Secondary | ICD-10-CM

## 2021-05-02 NOTE — Telephone Encounter (Signed)
Pt called scheduled colon appt for 05/21/21;  Per our conversation Prep instructions will be provided to patient in Mychart to discuss/concerns.   Patient aware and voiced understanding.  Pt states he will call if he has concerns.  Please advise thank you

## 2021-05-03 MED ORDER — PLENVU 140 G PO SOLR: 1.0000 | Freq: Once | ORAL | 0 refills | Status: AC

## 2021-05-03 NOTE — Telephone Encounter (Signed)
Completed colonoscopy instructions.  Sent Plenvu too pharmacy

## 2021-05-06 LAB — GI PROFILE, STOOL, PCR

## 2021-05-06 LAB — CALPROTECTIN, FECAL: Calprotectin, Fecal: 93 ug/g (ref 0–120)

## 2021-05-07 ENCOUNTER — Other Ambulatory Visit: Payer: Self-pay | Admitting: Gastroenterology

## 2021-05-14 ENCOUNTER — Other Ambulatory Visit: Payer: Self-pay | Admitting: Sports Medicine

## 2021-05-14 DIAGNOSIS — F5101 Primary insomnia: Secondary | ICD-10-CM

## 2021-05-21 ENCOUNTER — Ambulatory Visit (AMBULATORY_SURGERY_CENTER): Payer: 59 | Admitting: Gastroenterology

## 2021-05-21 ENCOUNTER — Other Ambulatory Visit: Payer: Self-pay

## 2021-05-21 ENCOUNTER — Encounter: Payer: Self-pay | Admitting: Gastroenterology

## 2021-05-21 VITALS — BP 124/74 | HR 67 | Temp 98.4°F | Resp 11 | Ht 68.0 in | Wt 161.0 lb

## 2021-05-21 DIAGNOSIS — D128 Benign neoplasm of rectum: Secondary | ICD-10-CM

## 2021-05-21 DIAGNOSIS — K6389 Other specified diseases of intestine: Secondary | ICD-10-CM

## 2021-05-21 DIAGNOSIS — K5730 Diverticulosis of large intestine without perforation or abscess without bleeding: Secondary | ICD-10-CM

## 2021-05-21 DIAGNOSIS — K5732 Diverticulitis of large intestine without perforation or abscess without bleeding: Secondary | ICD-10-CM

## 2021-05-21 DIAGNOSIS — R194 Change in bowel habit: Secondary | ICD-10-CM

## 2021-05-21 DIAGNOSIS — D125 Benign neoplasm of sigmoid colon: Secondary | ICD-10-CM

## 2021-05-21 DIAGNOSIS — R1030 Lower abdominal pain, unspecified: Secondary | ICD-10-CM

## 2021-05-21 DIAGNOSIS — K5900 Constipation, unspecified: Secondary | ICD-10-CM | POA: Diagnosis not present

## 2021-05-21 DIAGNOSIS — K621 Rectal polyp: Secondary | ICD-10-CM | POA: Diagnosis not present

## 2021-05-21 DIAGNOSIS — K648 Other hemorrhoids: Secondary | ICD-10-CM | POA: Diagnosis not present

## 2021-05-21 DIAGNOSIS — K640 First degree hemorrhoids: Secondary | ICD-10-CM

## 2021-05-21 MED ORDER — SODIUM CHLORIDE 0.9 % IV SOLN: 500.0000 mL | Freq: Once | INTRAVENOUS | Status: DC

## 2021-05-21 NOTE — Op Note (Signed)
[place] Patient Name: Peter Knight Procedure Date: 05/21/2021 7:55 AM MRN: [id number] Endoscopist: Gerrit Heck , MD Age: 45 Referring MD:  Date of Birth: 08-14-1976 Gender: Male Account #: [id number] Procedure:                Colonoscopy Indications:              Lower abdominal pain, Abnormal CT of the GI tract,                            Follow-up of diverticulitis, Constipation                           ER evaluation in 02/2021 for lower abdominal pain.                            Diagnosed with diverticulitis by exam and CT                            (sigmoid thickening), treated with Augmentin, then                            transitioned to Cipro/Flagyl. Peristent lower                            abdominal pain.                           Colonoscopy in 08/2018 with sigmoid diverticulosis                            and internal hemorrhoids. Medicines:                Monitored Anesthesia Care Procedure:                Pre-Anesthesia Assessment:                           - Prior to the procedure, a History and Physical                            was performed, and patient medications and                            allergies were reviewed. The patient's tolerance of                            previous anesthesia was also reviewed. The risks                            and benefits of the procedure and the sedation                            options and risks were discussed with the patient.                            All questions  were answered, and informed consent                            was obtained. Prior Anticoagulants: The patient has                            taken no previous anticoagulant or antiplatelet                            agents. ASA Grade Assessment: II - A patient with                            mild systemic disease. After reviewing the risks                            and benefits, the patient was deemed in                             satisfactory condition to undergo the procedure.                           After obtaining informed consent, the colonoscope                            was passed under direct vision. Throughout the                            procedure, the patient's blood pressure, pulse, and                            oxygen saturations were monitored continuously. The                            CF [id number] was introduced through the anus                            and advanced to the the terminal ileum. The                            colonoscopy was performed without difficulty. The                            patient tolerated the procedure well. The quality                            of the bowel preparation was good. The terminal                            ileum, ileocecal valve, appendiceal orifice, and                            rectum were photographed. Scope In: 8:06:06 AM Scope Out: 8:38:32 AM Scope Withdrawal Time: 0 hours 28 minutes 27 seconds  Total Procedure Duration: 0  hours 32 minutes 26 seconds  Findings:                 The perianal and digital rectal examinations were                            normal.                           A 5 mm polyp was found in the sigmoid colon. The                            polyp was sessile. The polyp was removed with a                            cold snare. Resection and retrieval were complete.                            Estimated blood loss was minimal. Estimated blood                            loss: none.                           Multiple small and large-mouthed diverticula were                            found in the sigmoid colon.                           A 3 mm polyp was found in the sigmoid colon. The                            polyp was sessile and located in the base of a                            diverticulum, located 30 cm from the anal verge.                            The polyp was carefully removed with a cold biopsy                             forceps. Resection and retrieval were complete.                            Estimated blood loss was minimal.                           A 3 mm polyp was found in the sigmoid colon. The                            polyp was sessile and located on the edge of a  diverticulum, located 30 cm from the anal verge.                            The polyp was carefully removed with a cold biopsy                            forceps. Resection and retrieval were complete.                            Estimated blood loss was minimal.                           Two sessile polyps were found in the recto-sigmoid                            colon. The polyps were 5 to 8 mm in size and                            located 15 cm from the anal verge. Each of the                            polyps appeared to be emanating from diverticula,                            and had an inflammatory appearance. Based on                            location and appearance, biopsies were taken with a                            cold forceps for histology rather than snare                            polypectomy. Estimated blood loss was minimal.                           Two sessile polyps were found in the rectum. The                            polyps were 1 to 2 mm in size. These polyps were                            removed with a cold biopsy forceps. Resection and                            retrieval were complete. Estimated blood loss was                            minimal.                           Non-bleeding internal hemorrhoids were found during  retroflexion. The hemorrhoids were small.                           The terminal ileum appeared normal. Complications:            No immediate complications. Estimated Blood Loss:     Estimated blood loss was minimal. Impression:               - One 5 mm polyp in the sigmoid colon, removed with                             a cold snare. Resected and retrieved.                           - Diverticulosis in the sigmoid colon.                           - One 3 mm polyp in the sigmoid colon, removed with                            a cold biopsy forceps. Resected and retrieved.                           - One 3 mm polyp in the sigmoid colon, removed with                            a cold biopsy forceps. Resected and retrieved.                           - Two 5 to 8 mm polyps at the recto-sigmoid colon.                            Biopsied.                           - Two 1 to 2 mm polyps in the rectum, removed with                            a cold biopsy forceps. Resected and retrieved.                           - Non-bleeding internal hemorrhoids.                           - The examined portion of the ileum was normal. Recommendation:           - Patient has a contact number available for                            emergencies. The signs and symptoms of potential                            delayed complications were discussed with the  patient. Return to normal activities tomorrow.                            Written discharge instructions were provided to the                            patient.                           - Resume previous diet.                           - Continue present medications.                           - Await pathology results.                           - Repeat colonoscopy for surveillance based on                            pathology results.                           - Use fiber, for example Citrucel, Fibercon, Konsyl                            or Metamucil.                           - Internal hemorrhoids were noted on this study and                            may be amenable to hemorrhoid band ligation. If you                            are interested in further treatment of these                            hemorrhoids with band ligation, please  contact my                            clinic to set up an appointment for evaluation and                            treatment. Gerrit Heck, MD 05/21/2021 8:51:21 AM

## 2021-05-21 NOTE — Progress Notes (Signed)
To PACU, VSS. Report to [email or web address].tb 

## 2021-05-21 NOTE — Progress Notes (Signed)
Medical history reviewed with no changes noted. VS assessed by C.W 

## 2021-05-21 NOTE — Patient Instructions (Signed)
Handouts given for polyps, diverticulosis, hemorrhoids, hemorrhoid banding and high fiber diet.  YOU HAD AN ENDOSCOPIC PROCEDURE TODAY AT [facility]:   Refer to the procedure report that was given to you for any specific questions about what was found during the examination.  If the procedure report does not answer your questions, please call your gastroenterologist to clarify.  If you requested that your care partner not be given the details of your procedure findings, then the procedure report has been included in a sealed envelope for you to review at your convenience later.  YOU SHOULD EXPECT: Some feelings of bloating in the abdomen. Passage of more gas than usual.  Walking can help get rid of the air that was put into your GI tract during the procedure and reduce the bloating. If you had a lower endoscopy (such as a colonoscopy or flexible sigmoidoscopy) you may notice spotting of blood in your stool or on the toilet paper. If you underwent a bowel prep for your procedure, you may not have a normal bowel movement for a few days.  Please Note:  You might notice some irritation and congestion in your nose or some drainage.  This is from the oxygen used during your procedure.  There is no need for concern and it should clear up in a day or so.  SYMPTOMS TO REPORT IMMEDIATELY:  Following lower endoscopy (colonoscopy or flexible sigmoidoscopy):  Excessive amounts of blood in the stool  Significant tenderness or worsening of abdominal pains  Swelling of the abdomen that is new, acute  Fever of 100F or higher  For urgent or emergent issues, a gastroenterologist can be reached at any hour by calling [phone]. Do not use MyChart messaging for urgent concerns.    DIET:  We do recommend a small meal at first, but then you may proceed to your regular diet.  Drink plenty of fluids but you should avoid alcoholic beverages for 24 hours.  ACTIVITY:  You should plan to take it  easy for the rest of today and you should NOT DRIVE or use heavy machinery until tomorrow (because of the sedation medicines used during the test).    FOLLOW UP: Our staff will call the number listed on your records 48-72 hours following your procedure to check on you and address any questions or concerns that you may have regarding the information given to you following your procedure. If we do not reach you, we will leave a message.  We will attempt to reach you two times.  During this call, we will ask if you have developed any symptoms of COVID 19. If you develop any symptoms (ie: fever, flu-like symptoms, shortness of breath, cough etc.) before then, please call [phone].  If you test positive for Covid 19 in the 2 weeks post procedure, please call and report this information to [place].    If any biopsies were taken you will be contacted by phone or by letter within the next 1-3 weeks.  Please call us at [phone] if you have not heard about the biopsies in 3 weeks.    SIGNATURES/CONFIDENTIALITY: You and/or your care partner have signed paperwork which will be entered into your electronic medical record.  These signatures attest to the fact that that the information above on your After Visit Summary has been reviewed and is understood.  Full responsibility of the confidentiality of this discharge information lies with you and/or your care-partner.

## 2021-05-21 NOTE — Progress Notes (Signed)
Called to room to assist during endoscopic procedure.  Patient ID and intended procedure confirmed with present staff. Received instructions for my participation in the procedure from the performing physician.  

## 2021-05-22 ENCOUNTER — Encounter: Payer: Self-pay | Admitting: Emergency Medicine

## 2021-05-22 ENCOUNTER — Emergency Department
Admission: EM | Admit: 2021-05-22 | Discharge: 2021-05-22 | Disposition: A | Discharge status: Home / Self Care | Payer: 59 | Source: Home / Self Care

## 2021-05-22 ENCOUNTER — Other Ambulatory Visit: Payer: Self-pay

## 2021-05-22 DIAGNOSIS — R310 Gross hematuria: Secondary | ICD-10-CM

## 2021-05-22 DIAGNOSIS — N3091 Cystitis, unspecified with hematuria: Secondary | ICD-10-CM | POA: Diagnosis not present

## 2021-05-22 LAB — POCT URINALYSIS DIP (MANUAL ENTRY)
Bilirubin, UA: NEGATIVE
Glucose, UA: NEGATIVE mg/dL
Ketones, POC UA: NEGATIVE mg/dL
Nitrite, UA: NEGATIVE
Protein Ur, POC: 100 mg/dL — AB
Spec Grav, UA: >=1.03 — AB (ref 1.010–1.025)
Urobilinogen, UA: 0.2 E.U./dL
pH, UA: 6 (ref 5.0–8.0)

## 2021-05-22 MED ORDER — NITROFURANTOIN MONOHYD MACRO 100 MG PO CAPS: 100.0000 mg | ORAL_CAPSULE | Freq: Two times a day (BID) | ORAL | 0 refills | Status: DC

## 2021-05-22 NOTE — ED Triage Notes (Signed)
Dysuria 2.5 weeks ago, but it cleared up and he did not have anymore symptoms. Yesterday he had a colonoscopy and this afternoon he peed pure blood., denies pain.

## 2021-05-22 NOTE — ED Provider Notes (Signed)
[facility]    CSN: [id number] Arrival date & time: 05/22/21  1548      History   Chief Complaint No chief complaint on file.   HPI Peter Knight is a 45 y.o. male.   HPI 45 year old male presents with hematuria for 2 days.  Reports had colonoscopy performed yesterday and urinated blood all day during the afternoon.  Past Medical History:  Diagnosis Date   Allergy     Patient Active Problem List   Diagnosis Date Noted   Acute diverticulitis 03/21/2021   Insomnia 02/11/2021   Erectile dysfunction 03/09/2019   Colitis 02/12/2018   Hyperlipidemia [id number]   Systolic murmur [id number]   Obstructive sleep apnea 09/02/2016   Mass of skin 09/02/2016   Annual physical exam 09/02/2016   Anxiety 09/02/2016    History reviewed. No pertinent surgical history.     Home Medications    Prior to Admission medications   Medication Sig Start Date End Date Taking? Authorizing Provider  nitrofurantoin, macrocrystal-monohydrate, (MACROBID) 100 MG capsule Take 1 capsule (100 mg total) by mouth 2 (two) times daily. 05/22/21  Yes Eliezer Lofts, FNP  dicyclomine (BENTYL) 10 MG capsule TAKE 1 CAPSULE (10 MG TOTAL) BY MOUTH EVERY 6 (SIX) HOURS AS NEEDED FOR SPASMS. Patient not taking: Reported on 05/21/2021 05/07/21   Cirigliano, Vito V, DO  senna-docusate (SENOKOT-S) 8.6-50 MG tablet Take 1 tablet by mouth 2 (two) times daily. Until stooling regularly Patient not taking: Reported on 05/21/2021 04/19/21   Silverio Decamp, MD  traZODone (DESYREL) 100 MG tablet TAKE 3 TABLETS (300 MG TOTAL) BY MOUTH AT BEDTIME. 05/14/21   Silverio Decamp, MD    Family History Family History  Problem Relation Age of Onset   Prostate cancer Father    Diverticulitis Father    Prostate cancer Paternal Uncle        said he just had his prostate removed   Colon cancer Neg Hx    Esophageal cancer Neg Hx    Pancreatic cancer Neg Hx    Stomach cancer Neg Hx     Social History Social  History   Tobacco Use   Smoking status: Never   Smokeless tobacco: Never  Vaping Use   Vaping Use: Never used  Substance Use Topics   Alcohol use: Yes   Drug use: Never     Allergies   Naproxen   Review of Systems Review of Systems  Genitourinary:  Positive for dysuria, frequency, hematuria and urgency.  All other systems reviewed and are negative.   Physical Exam Triage Vital Signs ED Triage Vitals  Enc Vitals Group     BP 05/22/21 1609 126/84     Pulse Rate 05/22/21 1609 70     Resp 05/22/21 1609 18     Temp 05/22/21 1609 98.5 F (36.9 C)     Temp Source 05/22/21 1609 Oral     SpO2 05/22/21 1609 98 %     Weight 05/22/21 1610 165 lb (74.8 kg)     Height 05/22/21 1610 '5\' 8"'$  (1.727 m)     Head Circumference --      Peak Flow --      Pain Score 05/22/21 1610 0     Pain Loc --      Pain Edu? --      Excl. in [place]? --    No data found.  Updated Vital Signs BP 126/84 (BP Location: Right Arm)   Pulse 70   Temp 98.5 F (  36.9 C) (Oral)   Resp 18   Ht '5\' 8"'$  (1.727 m)   Wt 165 lb (74.8 kg)   SpO2 98%   BMI 25.09 kg/m      Physical Exam Vitals and nursing note reviewed.  Constitutional:      General: He is not in acute distress.    Appearance: Normal appearance. He is not ill-appearing.  HENT:     Head: Normocephalic and atraumatic.     Mouth/Throat:     Mouth: Mucous membranes are moist.     Pharynx: Oropharynx is clear.  Eyes:     Extraocular Movements: Extraocular movements intact.     Conjunctiva/sclera: Conjunctivae normal.     Pupils: Pupils are equal, round, and reactive to light.  Cardiovascular:     Rate and Rhythm: Normal rate and regular rhythm.     Pulses: Normal pulses.     Heart sounds: Normal heart sounds. No murmur heard. Pulmonary:     Effort: Pulmonary effort is normal. No respiratory distress.     Breath sounds: Normal breath sounds. No stridor. No wheezing, rhonchi or rales.  Musculoskeletal:        General: Normal range of  motion.     Cervical back: Normal range of motion and neck supple. No tenderness.  Lymphadenopathy:     Cervical: No cervical adenopathy.  Skin:    General: Skin is warm and dry.  Neurological:     General: No focal deficit present.     Mental Status: He is alert and oriented to person, place, and time. Mental status is at baseline.  Psychiatric:        Mood and Affect: Mood normal.        Behavior: Behavior normal.     UC Treatments / Results  Labs (all labs ordered are listed, but only abnormal results are displayed) Labs Reviewed  POCT URINALYSIS DIP (MANUAL ENTRY) - Abnormal; Notable for the following components:      Result Value   Color, UA other (*)    Clarity, UA cloudy (*)    Spec Grav, UA >=1.030 (*)    Blood, UA large (*)    Protein Ur, POC =100 (*)    Leukocytes, UA Moderate (2+) (*)    All other components within normal limits  URINE CULTURE    EKG   Radiology No results found.  Procedures Procedures (including critical care time)  Medications Ordered in UC Medications - No data to display  Initial Impression / Assessment and Plan / UC Course  I have reviewed the triage vital signs and the nursing notes.  Pertinent labs & imaging results that were available during my care of the patient were reviewed by me and considered in my medical decision making (see chart for details).    MDM: 1.  Cystitis with hematuria-Rx'd Macrobid, urine culture ordered.  Patient advised to take medication as directed to completion and that we would follow-up with urine culture results once received.  Patient discharged home, hemodynamically stable. Final Clinical Impressions(s) / UC Diagnoses   Final diagnoses:  Gross hematuria  Cystitis with hematuria     Discharge Instructions      Advised patient to take medication as directed to completion.  Advised patient we will follow-up with urine culture results once received. Encouraged to increase daily water intake to  48 ounces per day.     ED Prescriptions     Medication Sig Dispense Auth. Provider   nitrofurantoin, macrocrystal-monohydrate, (MACROBID) 100 MG  capsule Take 1 capsule (100 mg total) by mouth 2 (two) times daily. 10 capsule Eliezer Lofts, FNP      PDMP not reviewed this encounter.   Eliezer Lofts, [place] 05/22/21 1713

## 2021-05-22 NOTE — Discharge Instructions (Addendum)
Advised patient to take medication as directed to completion.  Advised patient we will follow-up with urine culture results once received. Encouraged to increase daily water intake to 48 ounces per day.

## 2021-05-23 ENCOUNTER — Telehealth: Payer: Self-pay | Admitting: *Deleted

## 2021-05-23 NOTE — Telephone Encounter (Signed)
  Follow up Call-  Call back number 05/21/2021 09/07/2018  Post procedure Call Back phone  # [phone]  Permission to leave phone message Yes Yes  Some recent data might be hidden     Patient questions:  Do you have a fever, pain , or abdominal swelling? No. Pain Score  0 *  Have you tolerated food without any problems? Yes.    Have you been able to return to your normal activities? Yes.    Do you have any questions about your discharge instructions: Diet   No. Medications  No. Follow up visit  No.  Do you have questions or concerns about your Care? Yes.   Pt started passing blood in his urine yesterday. Went to urgent care. Urine culture isn't back yet but the patient was started on antibiotics.  No bleeding, pain or fever related to the colonscopy. Will let Dr. Bryan Lemma know. Actions: * If pain score is 4 or above: No action needed, pain <4.  Have you developed a fever since your procedure? no  2.   Have you had an respiratory symptoms (SOB or cough) since your procedure? no  3.   Have you tested positive for COVID 19 since your procedure no  4.   Have you had any family members/close contacts diagnosed with the COVID 19 since your procedure?  no   If yes to any of these questions please route to Joylene John, RN and Joella Prince, RN

## 2021-05-24 ENCOUNTER — Other Ambulatory Visit: Payer: Self-pay | Admitting: Gastroenterology

## 2021-05-24 ENCOUNTER — Telehealth (HOSPITAL_COMMUNITY): Payer: Self-pay | Admitting: Emergency Medicine

## 2021-05-24 ENCOUNTER — Telehealth: Payer: Self-pay | Admitting: Emergency Medicine

## 2021-05-24 ENCOUNTER — Encounter: Payer: Self-pay | Admitting: Gastroenterology

## 2021-05-24 LAB — URINE CULTURE
MICRO NUMBER:: 12200063
SPECIMEN QUALITY:: ADEQUATE

## 2021-05-24 MED ORDER — SULFAMETHOXAZOLE-TRIMETHOPRIM 800-160 MG PO TABS: 1.0000 | ORAL_TABLET | Freq: Two times a day (BID) | ORAL | 0 refills | Status: AC

## 2021-05-24 MED ORDER — SULFAMETHOXAZOLE-TRIMETHOPRIM 800-160 MG PO TABS: 1.0000 | ORAL_TABLET | Freq: Two times a day (BID) | ORAL | 0 refills | Status: DC

## 2021-05-24 NOTE — Telephone Encounter (Signed)
Call back to [facility] regarding medicine - unable to p/u meds since he is in [place] on vacation- requests Bactrim DS be sent to [facility] in [place]. Pharmacy changed per pt request. Dr Assunta Found updated by RN.

## 2021-08-30 ENCOUNTER — Other Ambulatory Visit: Payer: Self-pay

## 2021-08-30 ENCOUNTER — Emergency Department
Admission: EM | Admit: 2021-08-30 | Discharge: 2021-08-30 | Disposition: A | Discharge status: Home / Self Care | Payer: 59 | Source: Home / Self Care | Attending: Family Medicine | Admitting: Family Medicine

## 2021-08-30 DIAGNOSIS — J0101 Acute recurrent maxillary sinusitis: Secondary | ICD-10-CM | POA: Diagnosis not present

## 2021-08-30 MED ORDER — AMOXICILLIN-POT CLAVULANATE 875-125 MG PO TABS: ORAL_TABLET | ORAL | 0 refills | Status: DC

## 2021-08-30 MED ORDER — PREDNISONE 20 MG PO TABS: ORAL_TABLET | ORAL | 0 refills | Status: DC

## 2021-08-30 NOTE — ED Provider Notes (Signed)
[name]    CSN: [id number] Arrival date & time: 08/30/21  1202      History   Chief Complaint Chief Complaint  Patient presents with   Nasal Congestion    HPI Peter Knight is a 45 y.o. male.   One week ago patient developed sinus congestion and a mild cough.  He has developed increasing pressure in his sinus areas and his ears feel full.  His cough increased yesterday.  He denies shortness of breath and pleuritic pain. He has a history of perennial allergic rhinitis.  The history is provided by the patient.   Past Medical History:  Diagnosis Date   Allergy     Patient Active Problem List   Diagnosis Date Noted   Acute diverticulitis 03/21/2021   Insomnia 02/11/2021   Erectile dysfunction 03/09/2019   Colitis 02/12/2018   Hyperlipidemia 53/61/4431   Systolic murmur 54/00/8676   Obstructive sleep apnea 09/02/2016   Mass of skin 09/02/2016   Annual physical exam 09/02/2016   Anxiety 09/02/2016    History reviewed. No pertinent surgical history.     Home Medications    Prior to Admission medications   Medication Sig Start Date End Date Taking? Authorizing Provider  amoxicillin-clavulanate (AUGMENTIN) 875-125 MG tablet Take one tab PO Q12hr 08/30/21  Yes Vicci Reder, Ishmael Holter, MD  predniSONE (DELTASONE) 20 MG tablet Take one tab by mouth twice daily for 4 days, then one daily for 3 days. Take with food. 08/30/21  Yes Kandra Nicolas, MD  dicyclomine (BENTYL) 10 MG capsule TAKE 1 CAPSULE (10 MG TOTAL) BY MOUTH EVERY 6 (SIX) HOURS AS NEEDED FOR SPASMS. Patient not taking: Reported on 08/30/2021 05/24/21   Cirigliano, Dominic Pea, DO  nitrofurantoin, macrocrystal-monohydrate, (MACROBID) 100 MG capsule Take 1 capsule (100 mg total) by mouth 2 (two) times daily. Patient not taking: Reported on 08/30/2021 05/22/21   Eliezer Lofts, FNP  senna-docusate (SENOKOT-S) 8.6-50 MG tablet Take 1 tablet by mouth 2 (two) times daily. Until stooling regularly Patient not  taking: Reported on 05/21/2021 04/19/21   Silverio Decamp, MD  traZODone (DESYREL) 100 MG tablet TAKE 3 TABLETS (300 MG TOTAL) BY MOUTH AT BEDTIME. 05/14/21   Silverio Decamp, MD    Family History Family History  Problem Relation Age of Onset   Prostate cancer Father    Diverticulitis Father    Prostate cancer Paternal Uncle        said he just had his prostate removed   Colon cancer Neg Hx    Esophageal cancer Neg Hx    Pancreatic cancer Neg Hx    Stomach cancer Neg Hx     Social History Social History   Tobacco Use   Smoking status: Never   Smokeless tobacco: Never  Vaping Use   Vaping Use: Never used  Substance Use Topics   Alcohol use: Yes   Drug use: Never     Allergies   Naproxen   Review of Systems Review of Systems No sore throat + cough No pleuritic pain No wheezing + nasal congestion + post-nasal drainage + sinus pain/pressure No itchy/red eyes ? earache No hemoptysis No SOB No fever/chills No nausea No vomiting No abdominal pain No diarrhea No urinary symptoms No skin rash No fatigue No myalgias No headache Used OTC meds (Mucinex Max and Flonase) without relief   Physical Exam Triage Vital Signs ED Triage Vitals  Enc Vitals Group     BP 08/30/21 1217 128/83  Pulse Rate 08/30/21 1217 83     Resp 08/30/21 1217 14     Temp 08/30/21 1217 98.1 F (36.7 C)     Temp Source 08/30/21 1217 Oral     SpO2 08/30/21 1217 96 %     Weight --      Height --      Head Circumference --      Peak Flow --      Pain Score 08/30/21 1218 3     Pain Loc --      Pain Edu? --      Excl. in [place]? --    No data found.  Updated Vital Signs BP 128/83 (BP Location: Left Arm)   Pulse 83   Temp 98.1 F (36.7 C) (Oral)   Resp 14   SpO2 96%   Visual Acuity Right Eye Distance:   Left Eye Distance:   Bilateral Distance:    Right Eye Near:   Left Eye Near:    Bilateral Near:     Physical Exam Nursing notes and Vital Signs  reviewed. Appearance:  Patient appears stated age, and in no acute distress Eyes:  Pupils are equal, round, and reactive to light and accomodation.  Extraocular movement is intact.  Conjunctivae are not inflamed  Ears:  Canals normal.  Tympanic membranes normal.  Nose:  Congested turbinates.  Maxillary sinus tenderness is present.  Pharynx:  Normal Neck:  Supple.  Mildly enlarged lateral nodes are present, tender to palpation on the left.   Lungs:  Clear to auscultation.  Breath sounds are equal.  Moving air well. Heart:  Regular rate and rhythm without murmurs, rubs, or gallops.  Abdomen:  Nontender without masses or hepatosplenomegaly.  Bowel sounds are present.  No CVA or flank tenderness.  Extremities:  No edema.  Skin:  No rash present.   UC Treatments / Results  Labs (all labs ordered are listed, but only abnormal results are displayed) Labs Reviewed - No data to display  EKG   Radiology No results found.  Procedures Procedures (including critical care time)  Medications Ordered in UC Medications - No data to display  Initial Impression / Assessment and Plan / UC Course  I have reviewed the triage vital signs and the nursing notes.  Pertinent labs & imaging results that were available during my care of the patient were reviewed by me and considered in my medical decision making (see chart for details).    Begin Augmentin and prednisone burst/taper . Followup with Family Doctor if not improved in 7 to 10 days.  Final Clinical Impressions(s) / UC Diagnoses   Final diagnoses:  Acute recurrent maxillary sinusitis     Discharge Instructions      Take plain guaifenesin (1200mg  extended release tabs such as Mucinex) twice daily, with plenty of water, for cough and congestion.  May add Pseudoephedrine (30mg , one or two every 4 to 6 hours) for sinus congestion.  Get adequate rest.   Stop all antihistamines for now, and other non-prescription cough/cold preparations. If  cough increases, may take Delsym Cough Suppressant ("12 Hour Cough Relief") at bedtime for nighttime cough.      ED Prescriptions     Medication Sig Dispense Auth. Provider   amoxicillin-clavulanate (AUGMENTIN) 875-125 MG tablet Take one tab PO Q12hr 20 tablet Kandra Nicolas, MD   predniSONE (DELTASONE) 20 MG tablet Take one tab by mouth twice daily for 4 days, then one daily for 3 days. Take with food. 11 tablet  Kandra Nicolas, MD         Kandra Nicolas, MD 09/01/21 1320

## 2021-08-30 NOTE — Discharge Instructions (Signed)
Take plain guaifenesin (1200mg  extended release tabs such as Mucinex) twice daily, with plenty of water, for cough and congestion.  May add Pseudoephedrine (30mg , one or two every 4 to 6 hours) for sinus congestion.  Get adequate rest.   Stop all antihistamines for now, and other non-prescription cough/cold preparations. If cough increases, may take Delsym Cough Suppressant ("12 Hour Cough Relief") at bedtime for nighttime cough.

## 2021-08-30 NOTE — ED Triage Notes (Signed)
PT presents with c/o a sinus infection x1 week. Mucinex has need been effective

## 2021-12-04 ENCOUNTER — Encounter: Payer: Self-pay | Admitting: Sports Medicine

## 2021-12-04 ENCOUNTER — Encounter: Payer: Self-pay | Admitting: Gastroenterology

## 2021-12-04 DIAGNOSIS — K5792 Diverticulitis of intestine, part unspecified, without perforation or abscess without bleeding: Secondary | ICD-10-CM

## 2021-12-05 ENCOUNTER — Other Ambulatory Visit: Payer: Self-pay

## 2021-12-05 MED ORDER — DICYCLOMINE HCL 10 MG PO CAPS: 10.0000 mg | ORAL_CAPSULE | Freq: Four times a day (QID) | ORAL | 0 refills | Status: DC | PRN

## 2021-12-05 MED ORDER — CIPROFLOXACIN HCL 750 MG PO TABS: 750.0000 mg | ORAL_TABLET | Freq: Two times a day (BID) | ORAL | 0 refills | Status: AC

## 2021-12-05 MED ORDER — METRONIDAZOLE 500 MG PO TABS: 500.0000 mg | ORAL_TABLET | Freq: Two times a day (BID) | ORAL | 0 refills | Status: AC

## 2021-12-05 MED ORDER — SENNOSIDES-DOCUSATE SODIUM 8.6-50 MG PO TABS: 1.0000 | ORAL_TABLET | Freq: Two times a day (BID) | ORAL | 0 refills | Status: DC

## 2021-12-05 NOTE — Telephone Encounter (Signed)
Spoke with pt and gave pt Dr. Vivia Ewing recommendations. Pt states pain originally started in his back this time and is not the same as the diverticulitis he had in the past. Let pt know that if he begins to have a fever, chills, not tolerating PO intake, or blood in the stool to call us back for expedited evaluation in office or possibly ER.

## 2021-12-05 NOTE — Telephone Encounter (Signed)
Based on that description, would be most likely abdominal pain/bloating secondary to constipation.  I agree with a trial of stool softener.  Please ensure drinking adequate fluids.  If needed can also add MiraLAX 1 cap/day for goal of soft stools without straining to have BM.  For the abdominal pain, please add Bentyl 10 mg prn Q6.  I saw that antibiotics were prescribed.  Not sure these are necessary as it does not sound like the diverticulitis he has had in the past.  Is that correct?  Otherwise, if fever, chills, not tolerating p.o. intake, blood in stool, or other concerns, would certainly warrant further evaluation to include labs, possible imaging, or expedited evaluation in the office or ER.

## 2022-02-14 DIAGNOSIS — J019 Acute sinusitis, unspecified: Secondary | ICD-10-CM | POA: Diagnosis not present

## 2022-03-04 ENCOUNTER — Ambulatory Visit (INDEPENDENT_AMBULATORY_CARE_PROVIDER_SITE_OTHER): Payer: BC Managed Care – PPO | Admitting: Sports Medicine

## 2022-03-04 ENCOUNTER — Encounter: Payer: Self-pay | Admitting: Sports Medicine

## 2022-03-04 DIAGNOSIS — R519 Headache, unspecified: Secondary | ICD-10-CM

## 2022-03-04 DIAGNOSIS — J0140 Acute pansinusitis, unspecified: Secondary | ICD-10-CM

## 2022-03-04 DIAGNOSIS — Z0000 Encounter for general adult medical examination without abnormal findings: Secondary | ICD-10-CM

## 2022-03-04 MED ORDER — PREDNISONE 50 MG PO TABS: ORAL_TABLET | ORAL | 0 refills | Status: DC

## 2022-03-04 MED ORDER — AZELASTINE HCL 0.1 % NA SOLN: 2.0000 | Freq: Two times a day (BID) | NASAL | 1 refills | Status: DC

## 2022-03-04 NOTE — Assessment & Plan Note (Signed)
Pleasant 46 year old male, has had increasing pain across his nose, maxillary and frontal sinuses. ?He was treated at an urgent care with Augmentin without much improvement, he has been using antihistamines and Flonase, Nasacort daily. ?Unfortunately continues to have significant discomfort, nasal discharge with postnasal drip sore throat, facial pain and pressure. ?Considering persistence of symptoms we will add steroids, nasal azelastine and a maxillofacial CT. ?Return to see me in 4 weeks. ?

## 2022-03-04 NOTE — Progress Notes (Signed)
? ? ?  Procedures performed today:   ? ?None. ? ?Independent interpretation of notes and tests performed by another provider:  ? ?None. ? ?Brief History, Exam, Impression, and Recommendations:   ? ?Facial pain ?Pleasant 46 year old male, has had increasing pain across his nose, maxillary and frontal sinuses. ?He was treated at an urgent care with Augmentin without much improvement, he has been using antihistamines and Flonase, Nasacort daily. ?Unfortunately continues to have significant discomfort, nasal discharge with postnasal drip sore throat, facial pain and pressure. ?Considering persistence of symptoms we will add steroids, nasal azelastine and a maxillofacial CT. ?Return to see me in 4 weeks. ? ?Annual physical exam ?Return in 4 weeks, annual physical, fasting, we can also follow-up his sinuses ? ?Chronic process with exacerbation and pharmacologic intervention ? ?___________________________________________ ?Gwen Her. Dianah Field, M.D., ABFM., CAQSM. ?Primary Care and Sports Medicine ?[place] ? ?Adjunct Instructor of Family Medicine  ?[employer] ?

## 2022-03-04 NOTE — Assessment & Plan Note (Signed)
Return in 4 weeks, annual physical, fasting, we can also follow-up his sinuses ?

## 2022-03-07 ENCOUNTER — Ambulatory Visit (INDEPENDENT_AMBULATORY_CARE_PROVIDER_SITE_OTHER): Payer: BC Managed Care – PPO

## 2022-03-07 DIAGNOSIS — Z872 Personal history of diseases of the skin and subcutaneous tissue: Secondary | ICD-10-CM | POA: Diagnosis not present

## 2022-03-07 DIAGNOSIS — H9203 Otalgia, bilateral: Secondary | ICD-10-CM

## 2022-03-07 DIAGNOSIS — R519 Headache, unspecified: Secondary | ICD-10-CM

## 2022-03-07 DIAGNOSIS — M272 Inflammatory conditions of jaws: Secondary | ICD-10-CM

## 2022-03-07 DIAGNOSIS — J358 Other chronic diseases of tonsils and adenoids: Secondary | ICD-10-CM | POA: Diagnosis not present

## 2022-03-07 DIAGNOSIS — J342 Deviated nasal septum: Secondary | ICD-10-CM | POA: Diagnosis not present

## 2022-03-07 DIAGNOSIS — J3489 Other specified disorders of nose and nasal sinuses: Secondary | ICD-10-CM | POA: Diagnosis not present

## 2022-03-07 MED ORDER — AZITHROMYCIN 250 MG PO TABS: ORAL_TABLET | ORAL | 0 refills | Status: DC

## 2022-03-07 MED ORDER — PHENYLEPHRINE HCL 10 MG PO TABS: 10.0000 mg | ORAL_TABLET | Freq: Three times a day (TID) | ORAL | 0 refills | Status: DC

## 2022-03-07 NOTE — Assessment & Plan Note (Signed)
To recap, Peter Knight is a 46 year old male with increasing pain across the nose, maxillary, and frontal sinuses, he was treated to urgent care with Augmentin without much improvement, has been using antihistamines, Flonase, Nasacort daily. Facial pain and pressure was the worst, we added some steroids and switched to nasal azelastine, unfortunately he has not improved. We obtained a maxillofacial CT that did show opacifications of many of the sinus drainage pathways, he will add azithromycin and phenylephrine 3 times daily and I would like consultation with ENT.

## 2022-03-07 NOTE — Telephone Encounter (Signed)
Please advise 

## 2022-03-13 ENCOUNTER — Encounter: Payer: Self-pay | Admitting: Sports Medicine

## 2022-03-26 ENCOUNTER — Other Ambulatory Visit: Payer: Self-pay | Admitting: Sports Medicine

## 2022-03-26 DIAGNOSIS — R519 Headache, unspecified: Secondary | ICD-10-CM

## 2022-04-08 ENCOUNTER — Ambulatory Visit (INDEPENDENT_AMBULATORY_CARE_PROVIDER_SITE_OTHER): Payer: BC Managed Care – PPO | Admitting: Sports Medicine

## 2022-04-08 DIAGNOSIS — J0140 Acute pansinusitis, unspecified: Secondary | ICD-10-CM | POA: Diagnosis not present

## 2022-04-08 MED ORDER — PREDNISONE 10 MG (48) PO TBPK: ORAL_TABLET | Freq: Every day | ORAL | 0 refills | Status: DC

## 2022-04-08 MED ORDER — AZITHROMYCIN 250 MG PO TABS: ORAL_TABLET | ORAL | 0 refills | Status: DC

## 2022-04-08 NOTE — Progress Notes (Signed)
    Procedures performed today:    None.  Independent interpretation of notes and tests performed by another provider:   None.  Brief History, Exam, Impression, and Recommendations:    Acute pansinusitis Pleasant 46 year old male, he has increasing pain across the nose, maxillary, frontal sinuses. He does historically have a history of obstructive sleep apnea, was prescribed CPAP with nasal pillow but did not tolerate this. He has had a couple of courses of steroids already, at the last visit we did a 5-day course of prednisone, nasal azelastine, azithromycin, oral phenylephrine. We also got a maxillofacial CT that showed opacifications of many of the sinus drainage pathways. I did order a consultation with ENT, this is scheduled for next month. I really do not have a whole lot else to offer him, we will do another course of azithromycin and a prednisone taper to give him another burst of short-lived relief until he can see the ENT doctor.  Chronic process with exacerbation and pharmacologic intervention  ___________________________________________ Gwen Her. Dianah Field, M.D., ABFM., CAQSM. Primary Care and [place] Instructor of [place] [employer]

## 2022-04-08 NOTE — Assessment & Plan Note (Signed)
Pleasant 46 year old male, he has increasing pain across the nose, maxillary, frontal sinuses. He does historically have a history of obstructive sleep apnea, was prescribed CPAP with nasal pillow but did not tolerate this. He has had a couple of courses of steroids already, at the last visit we did a 5-day course of prednisone, nasal azelastine, azithromycin, oral phenylephrine. We also got a maxillofacial CT that showed opacifications of many of the sinus drainage pathways. I did order a consultation with ENT, this is scheduled for next month. I really do not have a whole lot else to offer him, we will do another course of azithromycin and a prednisone taper to give him another burst of short-lived relief until he can see the ENT doctor.

## 2022-04-21 DIAGNOSIS — J329 Chronic sinusitis, unspecified: Secondary | ICD-10-CM | POA: Diagnosis not present

## 2022-04-30 ENCOUNTER — Other Ambulatory Visit: Payer: Self-pay | Admitting: Sports Medicine

## 2022-04-30 DIAGNOSIS — R519 Headache, unspecified: Secondary | ICD-10-CM

## 2022-05-09 DIAGNOSIS — J329 Chronic sinusitis, unspecified: Secondary | ICD-10-CM | POA: Diagnosis not present

## 2022-05-14 ENCOUNTER — Other Ambulatory Visit: Payer: Self-pay | Admitting: Sports Medicine

## 2022-05-14 DIAGNOSIS — R519 Headache, unspecified: Secondary | ICD-10-CM

## 2022-08-04 ENCOUNTER — Other Ambulatory Visit: Payer: Self-pay | Admitting: Sports Medicine

## 2022-08-04 DIAGNOSIS — F5101 Primary insomnia: Secondary | ICD-10-CM

## 2022-08-05 ENCOUNTER — Ambulatory Visit
Admission: EM | Admit: 2022-08-05 | Discharge: 2022-08-05 | Disposition: A | Discharge status: Home / Self Care | Payer: BC Managed Care – PPO | Attending: Family Medicine | Admitting: Family Medicine

## 2022-08-05 ENCOUNTER — Encounter: Payer: Self-pay | Admitting: Sports Medicine

## 2022-08-05 DIAGNOSIS — H6123 Impacted cerumen, bilateral: Secondary | ICD-10-CM

## 2022-08-05 DIAGNOSIS — J0100 Acute maxillary sinusitis, unspecified: Secondary | ICD-10-CM

## 2022-08-05 MED ORDER — AMOXICILLIN-POT CLAVULANATE 875-125 MG PO TABS: 1.0000 | ORAL_TABLET | Freq: Two times a day (BID) | ORAL | 0 refills | Status: AC

## 2022-08-05 NOTE — Discharge Instructions (Addendum)
Advised patient to take medication as directed with food to completion.  Encouraged patient to increase daily water intake while taking this medication.  Advised if symptoms worsen and/or unresolved please follow-up with PCP or here for further evaluation. 

## 2022-08-05 NOTE — ED Provider Notes (Signed)
[name]    CSN: [id number] Arrival date & time: 08/05/22  1734      History   Chief Complaint Chief Complaint  Patient presents with   Ear Fullness    HPI Peter Knight is a 46 y.o. male.   HPI 46 year old male male presents with right ear fullness that began 5 days ago.  PMH significant for HLD and OSA.  Past Medical History:  Diagnosis Date   Allergy     Patient Active Problem List   Diagnosis Date Noted   Acute pansinusitis 03/04/2022   Acute diverticulitis 03/21/2021   Insomnia 02/11/2021   Erectile dysfunction 03/09/2019   Colitis 02/12/2018   Hyperlipidemia 80/99/8338   Systolic murmur 25/02/3975   Obstructive sleep apnea 09/02/2016   Mass of skin 09/02/2016   Annual physical exam 09/02/2016   Anxiety 09/02/2016    History reviewed. No pertinent surgical history.     Home Medications    Prior to Admission medications   Medication Sig Start Date End Date Taking? Authorizing Provider  amoxicillin-clavulanate (AUGMENTIN) 875-125 MG tablet Take 1 tablet by mouth 2 (two) times daily for 7 days. 08/05/22 08/12/22 Yes Eliezer Lofts, FNP  Azelastine HCl 137 MCG/SPRAY SOLN PLACE 2 SPRAYS INTO BOTH NOSTRILS 2 (TWO) TIMES DAILY AS DIRECTED Patient not taking: Reported on 08/05/2022 04/30/22   Silverio Decamp, MD  azithromycin (ZITHROMAX Z-PAK) 250 MG tablet Take 2 tablets (500 mg) on  Day 1,  followed by 1 tablet (250 mg) once daily on Days 2 through 5. Patient not taking: Reported on 08/05/2022 04/08/22   Silverio Decamp, MD  phenylephrine (SUDAFED PE) 10 MG TABS tablet Take 1 tablet (10 mg total) by mouth every 8 (eight) hours. Patient not taking: Reported on 08/05/2022 03/07/22   Silverio Decamp, MD  predniSONE (STERAPRED UNI-PAK 48 TAB) 10 MG (48) TBPK tablet Take by mouth daily. 12-day taper pack, use as directed for taper Patient not taking: Reported on 08/05/2022 04/08/22   Silverio Decamp, MD  senna-docusate  (SENOKOT-S) 8.6-50 MG tablet Take 1 tablet by mouth 2 (two) times daily. Until stooling regularly Patient not taking: Reported on 08/05/2022 12/05/21   Silverio Decamp, MD  traZODone (DESYREL) 100 MG tablet TAKE 3 TABLETS BY MOUTH AT BEDTIME 08/04/22   Silverio Decamp, MD    Family History Family History  Problem Relation Age of Onset   Prostate cancer Father    Diverticulitis Father    Prostate cancer Paternal Uncle        said he just had his prostate removed   Colon cancer Neg Hx    Esophageal cancer Neg Hx    Pancreatic cancer Neg Hx    Stomach cancer Neg Hx     Social History Social History   Tobacco Use   Smoking status: Never   Smokeless tobacco: Never  Vaping Use   Vaping Use: Never used  Substance Use Topics   Alcohol use: Yes   Drug use: Never     Allergies   Naproxen   Review of Systems Review of Systems  HENT:         Bilateral ear fullness x5 days  All other systems reviewed and are negative.    Physical Exam Triage Vital Signs ED Triage Vitals  Enc Vitals Group     BP 08/05/22 1748 129/86     Pulse Rate 08/05/22 1748 80     Resp 08/05/22 1748 14     Temp 08/05/22  1748 98.6 F (37 C)     Temp Source 08/05/22 1748 Oral     SpO2 08/05/22 1748 96 %     Weight --      Height --      Head Circumference --      Peak Flow --      Pain Score 08/05/22 1746 3     Pain Loc --      Pain Edu? --      Excl. in [place]? --    No data found.  Updated Vital Signs BP 129/86 (BP Location: Right Arm)   Pulse 80   Temp 98.6 F (37 C) (Oral)   Resp 14   SpO2 96%     Physical Exam Vitals and nursing note reviewed.  Constitutional:      General: He is not in acute distress.    Appearance: Normal appearance. He is normal weight. He is not ill-appearing.  HENT:     Head: Normocephalic and atraumatic.     Right Ear: External ear normal.     Left Ear: External ear normal.     Ears:     Comments: Bilateral EACs occluded with cerumen unable  to visualize either TM; post bilateral ear lavage: Bilateral EACs are clear with moderate eustachian tube dysfunction noted bilaterally    Mouth/Throat:     Mouth: Mucous membranes are moist.     Pharynx: Oropharynx is clear.  Eyes:     Extraocular Movements: Extraocular movements intact.     Conjunctiva/sclera: Conjunctivae normal.     Pupils: Pupils are equal, round, and reactive to light.  Cardiovascular:     Rate and Rhythm: Normal rate and regular rhythm.     Pulses: Normal pulses.     Heart sounds: Normal heart sounds. No murmur heard. Pulmonary:     Effort: Pulmonary effort is normal.     Breath sounds: Normal breath sounds. No wheezing, rhonchi or rales.  Musculoskeletal:        General: Normal range of motion.     Cervical back: Normal range of motion and neck supple.  Skin:    General: Skin is warm and dry.  Neurological:     General: No focal deficit present.     Mental Status: He is alert and oriented to person, place, and time.      UC Treatments / Results  Labs (all labs ordered are listed, but only abnormal results are displayed) Labs Reviewed - No data to display  EKG   Radiology No results found.  Procedures Procedures (including critical care time)  Medications Ordered in UC Medications - No data to display  Initial Impression / Assessment and Plan / UC Course  I have reviewed the triage vital signs and the nursing notes.  Pertinent labs & imaging results that were available during my care of the patient were reviewed by me and considered in my medical decision making (see chart for details).     MDM: 1.  Acute maxillary sinusitis, recurrence not specified-Rx'd Augmentin; 2.  Bilateral impacted cerumen-resolved with bilateral ear lavage. Final Clinical Impressions(s) / UC Diagnoses   Final diagnoses:  Bilateral impacted cerumen  Acute maxillary sinusitis, recurrence not specified     Discharge Instructions      Advised patient to take  medication as directed with food to completion.  Encouraged patient to increase daily water intake while taking this medication.  Advised if symptoms worsen and/or unresolved please follow-up with PCP or here for further  evaluation.     ED Prescriptions     Medication Sig Dispense Auth. Provider   amoxicillin-clavulanate (AUGMENTIN) 875-125 MG tablet Take 1 tablet by mouth 2 (two) times daily for 7 days. 14 tablet Eliezer Lofts, FNP      PDMP not reviewed this encounter.   Eliezer Lofts, [place] 08/05/22 1944

## 2022-08-05 NOTE — ED Triage Notes (Signed)
Pt presents with c/o rt ear fullness that began Thursday.

## 2022-08-06 ENCOUNTER — Ambulatory Visit: Payer: BC Managed Care – PPO | Admitting: Family Medicine

## 2022-09-30 ENCOUNTER — Encounter: Payer: Self-pay | Admitting: Sports Medicine

## 2022-09-30 ENCOUNTER — Ambulatory Visit (INDEPENDENT_AMBULATORY_CARE_PROVIDER_SITE_OTHER): Payer: BC Managed Care – PPO | Admitting: Sports Medicine

## 2022-09-30 VITALS — BP 124/82 | HR 67 | Wt 176.0 lb

## 2022-09-30 DIAGNOSIS — F5101 Primary insomnia: Secondary | ICD-10-CM | POA: Diagnosis not present

## 2022-09-30 DIAGNOSIS — N139 Obstructive and reflux uropathy, unspecified: Secondary | ICD-10-CM | POA: Diagnosis not present

## 2022-09-30 DIAGNOSIS — E785 Hyperlipidemia, unspecified: Secondary | ICD-10-CM | POA: Diagnosis not present

## 2022-09-30 DIAGNOSIS — Z0000 Encounter for general adult medical examination without abnormal findings: Secondary | ICD-10-CM

## 2022-09-30 MED ORDER — TRAZODONE HCL 150 MG PO TABS: 150.0000 mg | ORAL_TABLET | Freq: Every day | ORAL | 3 refills | Status: DC

## 2022-09-30 NOTE — Assessment & Plan Note (Signed)
Stable on trazodone 150 mg nightly, refilling with a year supply.

## 2022-09-30 NOTE — Progress Notes (Signed)
    Procedures performed today:    None.  Independent interpretation of notes and tests performed by another provider:   None.  Brief History, Exam, Impression, and Recommendations:    Annual physical exam Baby returns, he is a pleasant 46 year old male, declines Tdap and flu, we are going to postpone Tdap until next year. Checking routine labs today. Return to see me in a year.  Insomnia Stable on trazodone 150 mg nightly, refilling with a year supply.  Hyperlipidemia Crestor 20 mg sent in, recheck lipids fasting in 2 months.    ____________________________________________ Gwen Her. Dianah Field, M.D., ABFM., CAQSM., AME. Primary Care and Sports Medicine [facility]  Adjunct Professor of [place] [employer] [facility] of [employer]  Risk manager

## 2022-09-30 NOTE — Assessment & Plan Note (Signed)
Peter Knight returns, he is a pleasant 46 year old male, declines Tdap and flu, we are going to postpone Tdap until next year. Checking routine labs today. Return to see me in a year.

## 2022-10-02 LAB — COMPLETE METABOLIC PANEL WITH GFR
AG Ratio: 2.2 (calc) (ref 1.0–2.5)
ALT: 30 U/L (ref 9–46)
AST: 20 U/L (ref 10–40)
Albumin: 4.7 g/dL (ref 3.6–5.1)
Alkaline phosphatase (APISO): 64 U/L (ref 36–130)
BUN: 14 mg/dL (ref 7–25)
CO2: 29 mmol/L (ref 20–32)
Calcium: 9.5 mg/dL (ref 8.6–10.3)
Chloride: 105 mmol/L (ref 98–110)
Creat: 0.98 mg/dL (ref 0.60–1.29)
Globulin: 2.1 g/dL (calc) (ref 1.9–3.7)
Glucose, Bld: 93 mg/dL (ref 65–99)
Potassium: 4.6 mmol/L (ref 3.5–5.3)
Sodium: 141 mmol/L (ref 135–146)
Total Bilirubin: 0.7 mg/dL (ref 0.2–1.2)
Total Protein: 6.8 g/dL (ref 6.1–8.1)
eGFR: 97 mL/min/{1.73_m2} (ref >=60)

## 2022-10-02 LAB — LIPID PANEL
Cholesterol: 257 mg/dL — ABNORMAL HIGH (ref <200)
HDL: 49 mg/dL (ref >=40)
LDL Cholesterol (Calc): 169 mg/dL (calc) — ABNORMAL HIGH
Non-HDL Cholesterol (Calc): 208 mg/dL (calc) — ABNORMAL HIGH (ref <130)
Total CHOL/HDL Ratio: 5.2 (calc) — ABNORMAL HIGH (ref <5.0)
Triglycerides: 229 mg/dL — ABNORMAL HIGH (ref <150)

## 2022-10-02 LAB — HEMOGLOBIN A1C
Hgb A1c MFr Bld: 5.7 % of total Hgb — ABNORMAL HIGH (ref <5.7)
Mean Plasma Glucose: 117 mg/dL
eAG (mmol/L): 6.5 mmol/L

## 2022-10-02 LAB — CBC
HCT: 46 % (ref 38.5–50.0)
Hemoglobin: 15.3 g/dL (ref 13.2–17.1)
MCH: 29.7 pg (ref 27.0–33.0)
MCHC: 33.3 g/dL (ref 32.0–36.0)
MCV: 89.3 fL (ref 80.0–100.0)
MPV: 10.6 fL (ref 7.5–12.5)
Platelets: 243 10*3/uL (ref 140–400)
RBC: 5.15 10*6/uL (ref 4.20–5.80)
RDW: 12.2 % (ref 11.0–15.0)
WBC: 5.5 10*3/uL (ref 3.8–10.8)

## 2022-10-02 LAB — TSH: TSH: 1.98 mIU/L (ref 0.40–4.50)

## 2022-10-02 LAB — PSA, TOTAL AND FREE
PSA, % Free: 40 % (calc) (ref >25)
PSA, Free: 0.2 ng/mL
PSA, Total: 0.5 ng/mL (ref <=4.0)

## 2022-10-02 MED ORDER — ROSUVASTATIN CALCIUM 20 MG PO TABS: 20.0000 mg | ORAL_TABLET | Freq: Every day | ORAL | 3 refills | Status: DC

## 2022-10-02 NOTE — Assessment & Plan Note (Signed)
Crestor 20 mg sent in, recheck lipids fasting in 2 months.

## 2022-10-02 NOTE — Addendum Note (Signed)
Addended by: Silverio Decamp on: 10/02/2022 01:10 PM   Modules accepted: Orders

## 2022-10-03 ENCOUNTER — Encounter: Payer: Self-pay | Admitting: Sports Medicine

## 2023-01-22 ENCOUNTER — Ambulatory Visit: Payer: BC Managed Care – PPO | Admitting: Family Medicine

## 2023-07-28 IMAGING — CT CT MAXILLOFACIAL W/O CM
3 series · 14 of 47 positions shown, 16 images · non-contrast
Comparison: No pertinent prior exams available for comparison.

CLINICAL DATA: Provided history: Maxillary/facial abscess.
Additional history provided: Evaluation for sinusitis, recurrent
sinus infections, recently completed a course of antibiotics,
bilateral ear discomfort, maxillary sinus and frontal sinus
pressure.

EXAM:
CT MAXILLOFACIAL WITHOUT CONTRAST
TECHNIQUE: Multidetector CT images of the paranasal sinuses were obtained using
the standard protocol without intravenous contrast.
RADIATION DOSE REDUCTION: This exam was performed according to the
departmental dose-optimization program which includes automated
exposure control, adjustment of the mA and/or kV according to
patient size and/or use of iterative reconstruction technique.

[Series 2: max soft · axial · 0.37mm/px · z∈[-168,-40]mm · 8 of 76 slices shown, 10 images]
[im 6/76  brain]
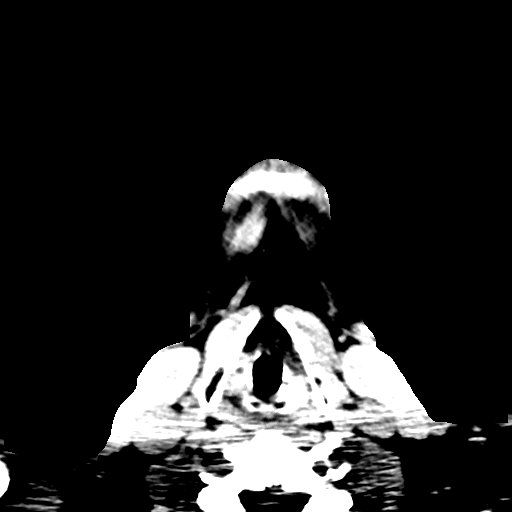
[im 6/76  bone]
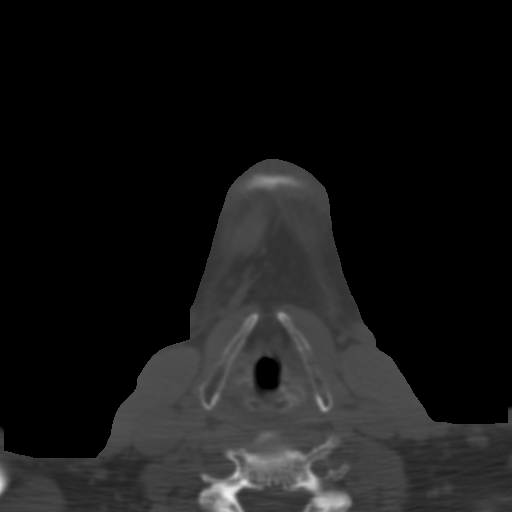
[im 16/76  bone]
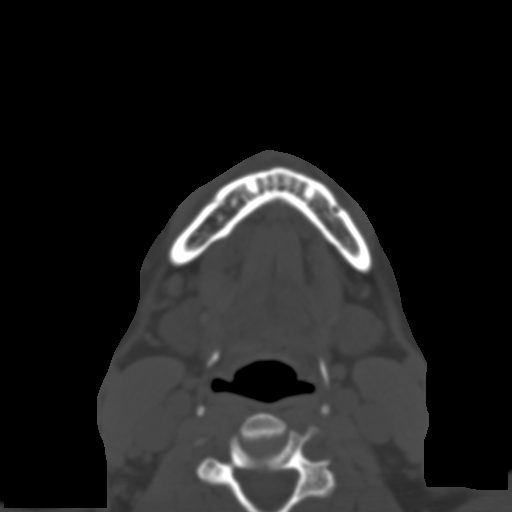
[im 24/76  bone]
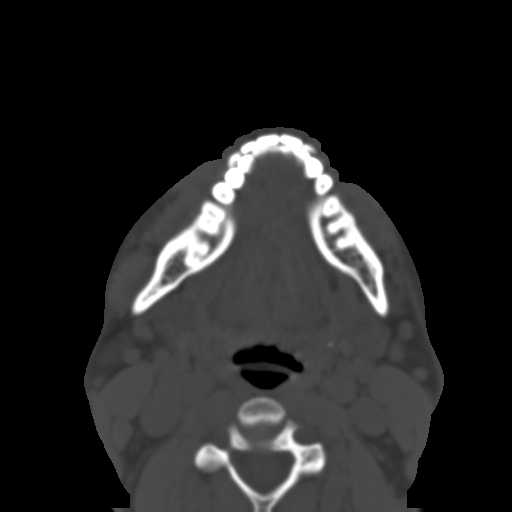
[im 34/76  bone]
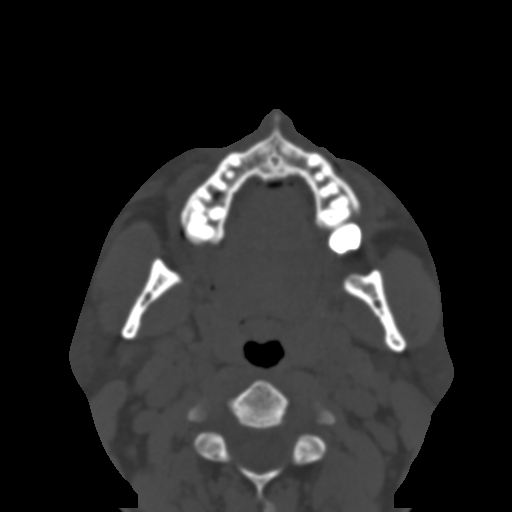
[im 42/76  brain]
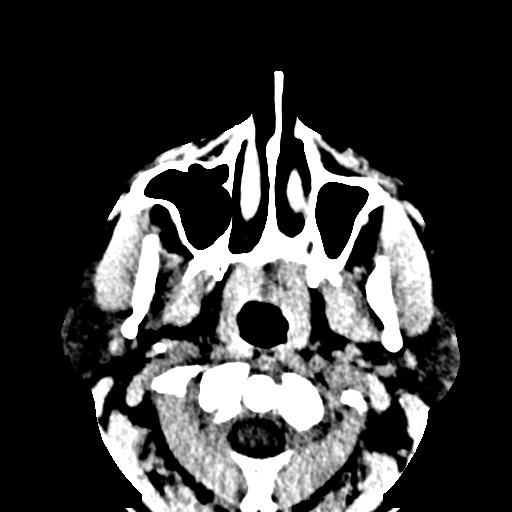
[im 42/76  bone]
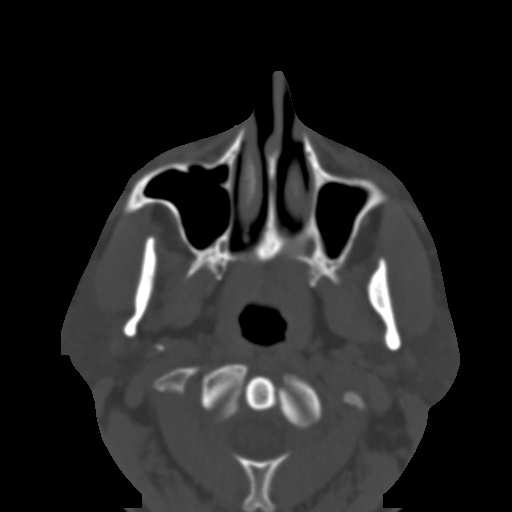
[im 52/76  bone]
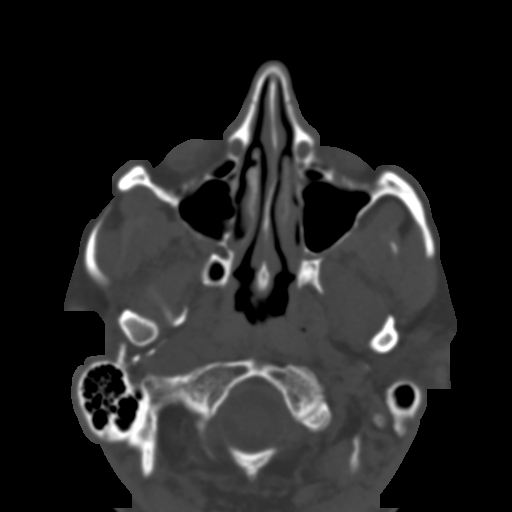
[im 60/76  bone]
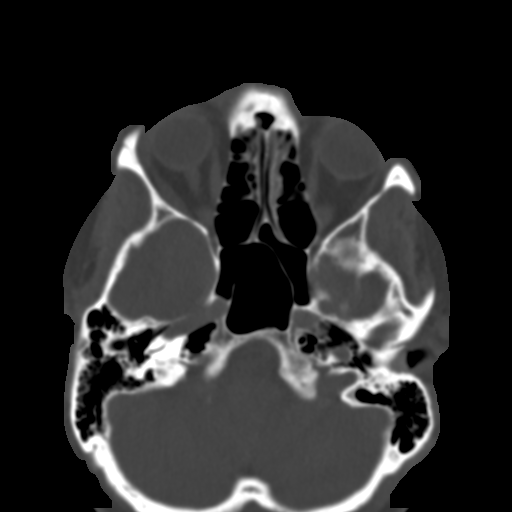
[im 70/76  bone]
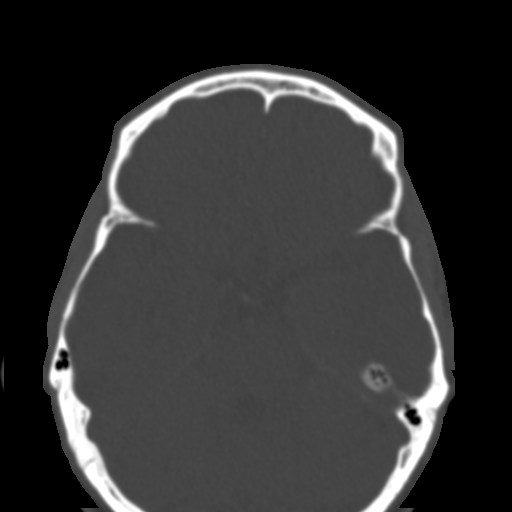

[Series 6: coronal soft · coronal · 0.34mm/px · 3 of 77 slices shown]
[im 26/77  bone]
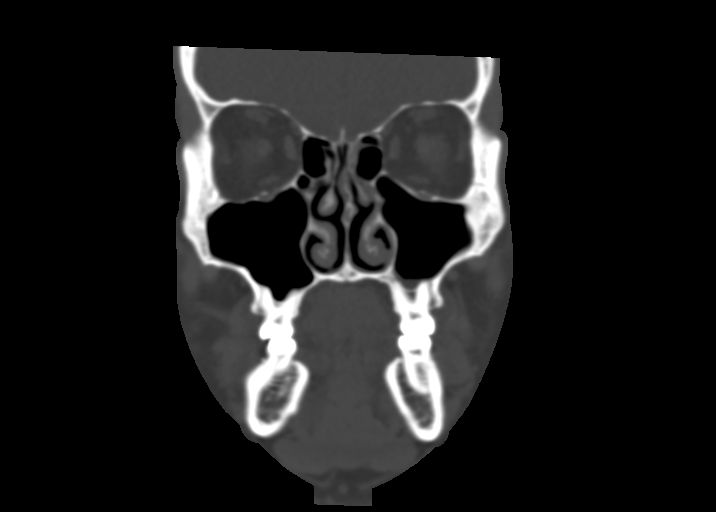
[im 34/77  bone]
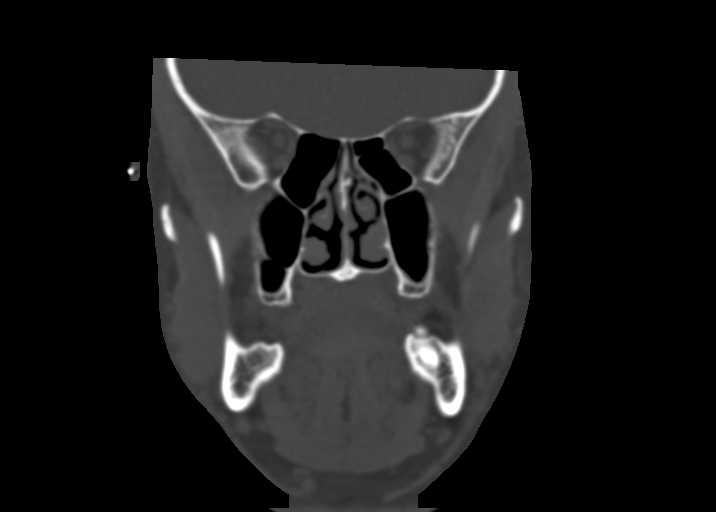
[im 43/77  bone]
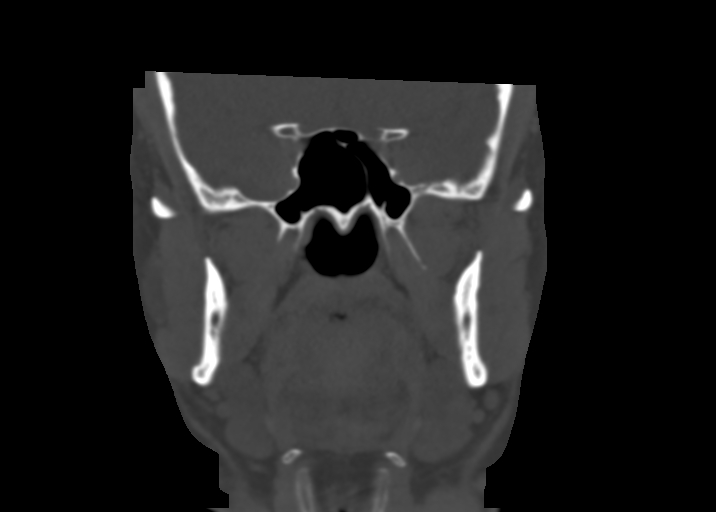

[Series 8: sagittal soft · sagittal · 0.30mm/px · 3 of 83 slices shown]
[im 28/83  bone]
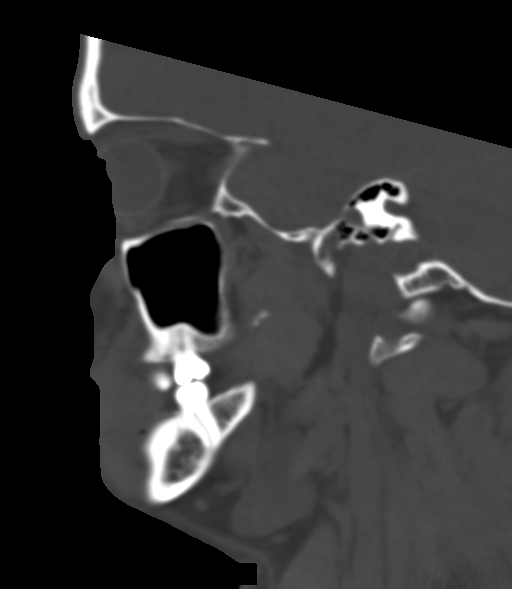
[im 42/83  bone]
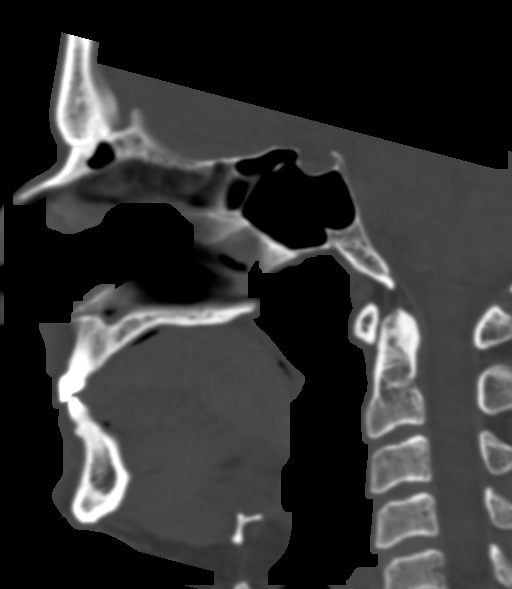
[im 55/83  bone]
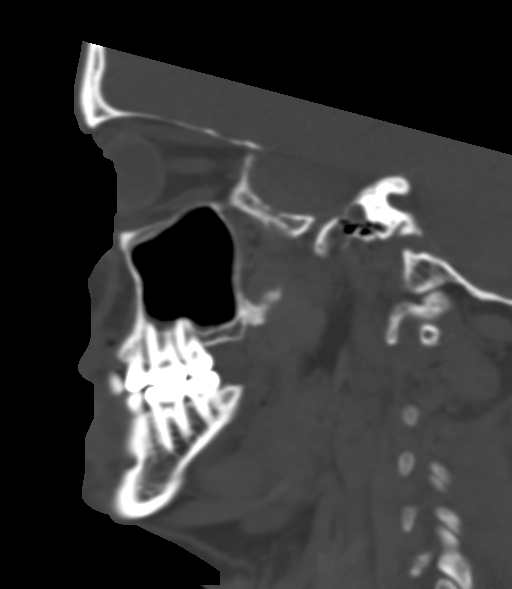

[14 of 47 positions shown; findings below may reference images not displayed]

FINDINGS: Paranasal sinuses:

Frontal: The right frontal sinus is normally aerated. Patent right
frontal sinus drainage pathway. Mucosal thickening within the left
frontoethmoidal recess. Partial opacification of the left frontal
sinus drainage pathway.

Ethmoid: Minimal mucosal thickening scattered within the anterior
ethmoid air cells, bilaterally.

Maxillary: The right maxillary sinus is normally aerated. Mild
mucosal thickening within the inferior left maxillary sinus.

Sphenoid: Normally aerated. The sphenoethmoidal recesses are
partially opacified, bilaterally.

Right ostiomeatal unit: Patent

Left ostiomeatal unit: The maxillary sinus ostium and ethmoid
infundibulum are opacified. Additionally, the hiatus semilunaris is
partially opacified.

Nasal passages: Leftward deviation of the nasal septum. Mild mucosal
thickening, and minimal secretions, within the bilateral nasal
passages.

Anatomy: No pneumatization superior to anterior ethmoid notches.
Symmetric and intact olfactory grooves and fovea ethmoidalis, Keros
I (1-3mm). Sellar sphenoid pneumatization pattern.

Other: Postinflammatory calcifications/tonsilloliths within the
bilateral palatine tonsils.
IMPRESSION: Mucosal thickening within the left frontoethmoidal recess. Partial
opacification of the left frontal sinus drainage pathway.

Minimal mucosal thickening within the anterior ethmoid air cells,
bilaterally.

Partial opacification of the sphenoethmoidal recesses, bilaterally.

Mild mucosal thickening within the inferior left maxillary sinus.
Partial opacification of the left ostiomeatal unit.

Leftward deviation of the nasal septum.

Mild mucosal thickening, and minimal secretions, within the
bilateral nasal passages.

## 2023-09-14 ENCOUNTER — Ambulatory Visit: Payer: Self-pay | Admitting: Sports Medicine

## 2023-09-16 ENCOUNTER — Encounter: Payer: Self-pay | Admitting: Sports Medicine

## 2023-09-16 ENCOUNTER — Ambulatory Visit (INDEPENDENT_AMBULATORY_CARE_PROVIDER_SITE_OTHER): Payer: 59 | Admitting: Sports Medicine

## 2023-09-16 VITALS — BP 123/82 | HR 72 | Ht 68.0 in | Wt 184.0 lb

## 2023-09-16 DIAGNOSIS — Z0000 Encounter for general adult medical examination without abnormal findings: Secondary | ICD-10-CM

## 2023-09-16 DIAGNOSIS — F5101 Primary insomnia: Secondary | ICD-10-CM

## 2023-09-16 DIAGNOSIS — E785 Hyperlipidemia, unspecified: Secondary | ICD-10-CM

## 2023-09-16 MED ORDER — ROSUVASTATIN CALCIUM 20 MG PO TABS: 20.0000 mg | ORAL_TABLET | Freq: Every day | ORAL | 3 refills | Status: AC

## 2023-09-16 MED ORDER — TRAZODONE HCL 150 MG PO TABS: 150.0000 mg | ORAL_TABLET | Freq: Every day | ORAL | 3 refills | Status: DC

## 2023-09-16 NOTE — Progress Notes (Signed)
Subjective:    CC: Annual Physical Exam  HPI:  This patient is here for their annual physical  I reviewed the past medical history, family history, social history, surgical history, and allergies today and no changes were needed.  Please see the problem list section below in epic for further details.  Past Medical History: Past Medical History:  Diagnosis Date   Allergy    Past Surgical History: No past surgical history on file. Social History: Social History   Socioeconomic History   Marital status: Married    Spouse name: Not on file   Number of children: 3   Years of education: Not on file   Highest education level: Not on file  Occupational History   Occupation: Independent sales rep   Tobacco Use   Smoking status: Never   Smokeless tobacco: Never  Vaping Use   Vaping status: Never Used  Substance and Sexual Activity   Alcohol use: Yes   Drug use: Never   Sexual activity: Not on file  Other Topics Concern   Not on file  Social History Narrative   Not on file   Social Determinants of Health   Financial Resource Strain: Not on file  Food Insecurity: Not on file  Transportation Needs: Not on file  Physical Activity: Not on file  Stress: Not on file  Social Connections: Unknown (03/04/2022)   Received from [facility]   Social Network    Social Network: Not on file   Family History: Family History  Problem Relation Age of Onset   Prostate cancer Father    Diverticulitis Father    Prostate cancer Paternal Uncle        said he just had his prostate removed   Colon cancer Neg Hx    Esophageal cancer Neg Hx    Pancreatic cancer Neg Hx    Stomach cancer Neg Hx    Allergies: Allergies  Allergen Reactions   Naproxen Other (See Comments)    Low abdominal pain   Medications: See med rec.  Review of Systems: No headache, visual changes, nausea, vomiting, diarrhea, constipation, dizziness, abdominal pain, skin rash, fevers, chills, night  sweats, swollen lymph nodes, weight loss, chest pain, body aches, joint swelling, muscle aches, shortness of breath, mood changes, visual or auditory hallucinations.  Objective:    General: Well Developed, well nourished, and in no acute distress.  Neuro: Alert and oriented x3, extra-ocular muscles intact, sensation grossly intact. Cranial nerves II through XII are intact, motor, sensory, and coordinative functions are all intact. HEENT: Normocephalic, atraumatic, pupils equal round reactive to light, neck supple, no masses, no lymphadenopathy, thyroid nonpalpable. Oropharynx, nasopharynx, external ear canals are unremarkable. Skin: Warm and dry, no rashes noted.  Cardiac: Regular rate and rhythm, no murmurs rubs or gallops.  Respiratory: Clear to auscultation bilaterally. Not using accessory muscles, speaking in full sentences.  Abdominal: Soft, nontender, nondistended, positive bowel sounds, no masses, no organomegaly.  Musculoskeletal: Shoulder, elbow, wrist, hip, knee, ankle stable, and with full range of motion.  Impression and Recommendations:    The patient was counselled, risk factors were discussed, anticipatory guidance given.  Insomnia Very well-controlled on trazodone, refilling.  Hyperlipidemia Inconsistent use of Crestor, I have advised him to be very consistent, he will come back in about a month to recheck fasting lipids, return to see me in 1 year.  Annual physical exam Annual physical, he will return for fasting labs. Declines Tdap and flu.   ____________________________________________ Ihor Austin. Benjamin Stain,  M.D., ABFM., CAQSM., AME. Primary Care and Sports Medicine [facility]  Adjunct Professor of Family Medicine  [employer]  Restaurant manager, fast food

## 2023-09-16 NOTE — Assessment & Plan Note (Signed)
Very well-controlled on trazodone, refilling.

## 2023-09-16 NOTE — Assessment & Plan Note (Signed)
Annual physical, he will return for fasting labs. Declines Tdap and flu.

## 2023-09-16 NOTE — Assessment & Plan Note (Signed)
Inconsistent use of Crestor, I have advised him to be very consistent, he will come back in about a month to recheck fasting lipids, return to see me in 1 year.

## 2023-09-18 ENCOUNTER — Other Ambulatory Visit: Payer: Self-pay | Admitting: Sports Medicine

## 2023-09-18 DIAGNOSIS — F5101 Primary insomnia: Secondary | ICD-10-CM

## 2023-10-09 ENCOUNTER — Encounter: Payer: Self-pay | Admitting: Sports Medicine

## 2023-10-09 LAB — COMPREHENSIVE METABOLIC PANEL
ALT: 35 [IU]/L (ref 0–44)
AST: 22 [IU]/L (ref 0–40)
Albumin: 4.3 g/dL (ref 4.1–5.1)
Alkaline Phosphatase: 74 [IU]/L (ref 44–121)
BUN/Creatinine Ratio: 14 (ref 9–20)
BUN: 15 mg/dL (ref 6–24)
Bilirubin Total: 0.5 mg/dL (ref 0.0–1.2)
CO2: 25 mmol/L (ref 20–29)
Calcium: 9.1 mg/dL (ref 8.7–10.2)
Chloride: 103 mmol/L (ref 96–106)
Creatinine, Ser: 1.1 mg/dL (ref 0.76–1.27)
Globulin, Total: 2.2 g/dL (ref 1.5–4.5)
Glucose: 93 mg/dL (ref 70–99)
Potassium: 4.4 mmol/L (ref 3.5–5.2)
Sodium: 141 mmol/L (ref 134–144)
Total Protein: 6.5 g/dL (ref 6.0–8.5)
eGFR: 84 mL/min/{1.73_m2} (ref >59)

## 2023-10-09 LAB — CBC
Hematocrit: 44 % (ref 37.5–51.0)
Hemoglobin: 14.2 g/dL (ref 13.0–17.7)
MCH: 29.3 pg (ref 26.6–33.0)
MCHC: 32.3 g/dL (ref 31.5–35.7)
MCV: 91 fL (ref 79–97)
Platelets: 265 10*3/uL (ref 150–450)
RBC: 4.85 x10E6/uL (ref 4.14–5.80)
RDW: 11.9 % (ref 11.6–15.4)
WBC: 5.2 10*3/uL (ref 3.4–10.8)

## 2023-10-09 LAB — HEMOGLOBIN A1C
Est. average glucose Bld gHb Est-mCnc: 123 mg/dL
Hgb A1c MFr Bld: 5.9 % — ABNORMAL HIGH (ref 4.8–5.6)

## 2023-10-09 LAB — LIPID PANEL
Chol/HDL Ratio: 3.8 {ratio} (ref 0.0–5.0)
Cholesterol, Total: 132 mg/dL (ref 100–199)
HDL: 35 mg/dL — ABNORMAL LOW (ref >39)
LDL Chol Calc (NIH): 72 mg/dL (ref 0–99)
Triglycerides: 139 mg/dL (ref 0–149)
VLDL Cholesterol Cal: 25 mg/dL (ref 5–40)

## 2023-10-09 LAB — TSH: TSH: 1.33 u[IU]/mL (ref 0.450–4.500)

## 2023-12-01 ENCOUNTER — Ambulatory Visit: Payer: Self-pay

## 2024-03-24 ENCOUNTER — Encounter (INDEPENDENT_AMBULATORY_CARE_PROVIDER_SITE_OTHER): Payer: Self-pay | Admitting: Sports Medicine

## 2024-03-24 ENCOUNTER — Telehealth: Payer: Self-pay

## 2024-03-24 DIAGNOSIS — U071 COVID-19: Secondary | ICD-10-CM | POA: Diagnosis not present

## 2024-03-24 MED ORDER — NIRMATRELVIR/RITONAVIR (PAXLOVID)TABLET: ORAL_TABLET | ORAL | 0 refills | Status: DC

## 2024-03-24 NOTE — Telephone Encounter (Signed)

## 2024-03-24 NOTE — Telephone Encounter (Signed)
 Patient informed.

## 2024-03-24 NOTE — Telephone Encounter (Signed)
 Copied from CRM [id number]. Topic: Clinical - Medication Question >> Mar 24, 2024  9:04 AM Blair Bumpers wrote: Reason for CRM: Patient states he tested positive for Covid & strep on Tuesday. Wants to know if Dr. Elva Hamburger can prescribe him Paxlovid. Patient states he's already taking Amoxicillin  as well. He mentioned that he did another at home test this morning & it's positive as well. Patient states he wants to know what Dr. Elva Hamburger would like for him to do. Please give patient a call back or respond to his MyChart message. CB #: [phone].

## 2024-03-24 NOTE — Telephone Encounter (Signed)
 Sure, Paxlovid is fine.  I will send it in but let him know that it is not as beneficial for healthy younger adults like him.  It is not indicated more for people with significant comorbidities that are older and then get COVID.

## 2024-04-28 ENCOUNTER — Ambulatory Visit

## 2024-04-28 ENCOUNTER — Other Ambulatory Visit: Payer: Self-pay

## 2024-04-28 ENCOUNTER — Ambulatory Visit
Admission: EM | Admit: 2024-04-28 | Discharge: 2024-04-28 | Disposition: A | Discharge status: Home / Self Care | Attending: Family Medicine | Admitting: Family Medicine

## 2024-04-28 DIAGNOSIS — R109 Unspecified abdominal pain: Secondary | ICD-10-CM

## 2024-04-28 DIAGNOSIS — K529 Noninfective gastroenteritis and colitis, unspecified: Secondary | ICD-10-CM

## 2024-04-28 DIAGNOSIS — Z8719 Personal history of other diseases of the digestive system: Secondary | ICD-10-CM

## 2024-04-28 DIAGNOSIS — K5730 Diverticulosis of large intestine without perforation or abscess without bleeding: Secondary | ICD-10-CM

## 2024-04-28 HISTORY — DX: Hyperlipidemia, unspecified: E78.5

## 2024-04-28 HISTORY — DX: Noninfective gastroenteritis and colitis, unspecified: K52.9

## 2024-04-28 LAB — POCT URINALYSIS DIP (MANUAL ENTRY)
Bilirubin, UA: NEGATIVE
Blood, UA: NEGATIVE
Glucose, UA: NEGATIVE mg/dL
Ketones, POC UA: NEGATIVE mg/dL
Leukocytes, UA: NEGATIVE
Nitrite, UA: NEGATIVE
Protein Ur, POC: NEGATIVE mg/dL
Spec Grav, UA: 1.025 (ref 1.010–1.025)
Urobilinogen, UA: 0.2 U/dL
pH, UA: 6 (ref 5.0–8.0)

## 2024-04-28 MED ORDER — CIPROFLOXACIN HCL 500 MG PO TABS: 500.0000 mg | ORAL_TABLET | Freq: Two times a day (BID) | ORAL | 0 refills | Status: DC

## 2024-04-28 MED ORDER — METRONIDAZOLE 500 MG PO TABS: 500.0000 mg | ORAL_TABLET | Freq: Two times a day (BID) | ORAL | 0 refills | Status: DC

## 2024-04-28 NOTE — ED Triage Notes (Signed)
 Has c/o low abd pain in pubic area the last few days, urinated a lot last night. Does not hurt to urinate, not malodorous urine. Reports some issues with constipation, reports went to bathroom for bm a little yesterday. Has hx of colitis and feels similar to this per patient. Took 2 senakot last night, has not had a bm today yet. Sees Dr. ONEIDA. Also took fiber powder yesterday.

## 2024-04-28 NOTE — Discharge Instructions (Addendum)
 Drink lots of water Take MiraLAX daily Bland or limited diet until you are back to normal Take antibiotic as directed, ciprofloxacin  and metronidazole  2 times a day Call to get a follow-up appoint with Dr. San

## 2024-04-28 NOTE — ED Provider Notes (Signed)
 [facility]    CSN: [id number] Arrival date & time: 04/28/24  1024      History   Chief Complaint Chief Complaint  Patient presents with   Abdominal Pain    HPI Peter Knight is a 48 y.o. male.   Pleasant 48 year old gentleman who is here for lower abdominal pain.  He states he has a history of colitis and diverticulitis in the past.  He also has a history of problems with constipation.  He states that he has not had a normal bowel movement for several days.  He did have a small bowel movement yesterday but it felt incomplete.  No urinary symptoms.  He has pain in the suprapubic and left lower quadrant regions.  Decreased appetite.  No nausea or vomiting.  No fever or chills.  No blood or mucus in the stool. Patient had colonoscopy in 2019 and in 2022.  These results were reviewed.  Patient did have a tubular adenoma in 2022, multiple polyps, sigmoid diverticulosis.  No inflammatory bowel disease    Past Medical History:  Diagnosis Date   Allergy    Colitis    Hyperlipidemia     Patient Active Problem List   Diagnosis Date Noted   Acute diverticulitis 03/21/2021   Insomnia 02/11/2021   Erectile dysfunction 03/09/2019   Colitis 02/12/2018   Hyperlipidemia 09/05/2016   Systolic murmur 09/02/2016   Obstructive sleep apnea 09/02/2016   Annual physical exam 09/02/2016   Anxiety 09/02/2016    History reviewed. No pertinent surgical history.     Home Medications    Prior to Admission medications   Medication Sig Start Date End Date Taking? Authorizing Provider  ciprofloxacin  (CIPRO ) 500 MG tablet Take 1 tablet (500 mg total) by mouth 2 (two) times daily. 04/28/24  Yes Maranda Jamee Jacob, MD  metroNIDAZOLE  (FLAGYL ) 500 MG tablet Take 1 tablet (500 mg total) by mouth 2 (two) times daily. 04/28/24  Yes Maranda Jamee Jacob, MD  rosuvastatin  (CRESTOR ) 20 MG tablet Take 1 tablet (20 mg total) by mouth daily. 09/16/23   Curtis Debby PARAS, MD  traZODone   (DESYREL ) 150 MG tablet Take 1 tablet (150 mg total) by mouth at bedtime. 09/16/23   Curtis Debby PARAS, MD    Family History Family History  Problem Relation Age of Onset   Prostate cancer Father    Diverticulitis Father    Prostate cancer Paternal Uncle        said he just had his prostate removed   Colon cancer Neg Hx    Esophageal cancer Neg Hx    Pancreatic cancer Neg Hx    Stomach cancer Neg Hx     Social History Social History   Tobacco Use   Smoking status: Never   Smokeless tobacco: Never  Vaping Use   Vaping status: Never Used  Substance Use Topics   Alcohol use: Yes   Drug use: Never     Allergies   Naproxen   Review of Systems Review of Systems   Physical Exam Triage Vital Signs ED Triage Vitals  Encounter Vitals Group     BP 04/28/24 1031 125/83     Girls Systolic BP Percentile --      Girls Diastolic BP Percentile --      Boys Systolic BP Percentile --      Boys Diastolic BP Percentile --      Pulse Rate 04/28/24 1031 85     Resp 04/28/24 1031 16     Temp  04/28/24 1031 99.1 F (37.3 C)     Temp src --      SpO2 04/28/24 1031 93 %     Weight --      Height --      Head Circumference --      Peak Flow --      Pain Score 04/28/24 1036 7     Pain Loc --      Pain Education --      Exclude from Growth Chart --    No data found.  Updated Vital Signs BP 125/83   Pulse 85   Temp 99.1 F (37.3 C)   Resp 16   SpO2 93%       Physical Exam Constitutional:      General: He is not in acute distress.    Appearance: He is well-developed and normal weight. He is ill-appearing.     Comments: He appears uncomfortable  HENT:     Head: Normocephalic and atraumatic.  Eyes:     Conjunctiva/sclera: Conjunctivae normal.     Pupils: Pupils are equal, round, and reactive to light.  Cardiovascular:     Rate and Rhythm: Normal rate.  Pulmonary:     Effort: Pulmonary effort is normal. No respiratory distress.  Abdominal:     General: Bowel  sounds are normal. There is no distension.     Palpations: Abdomen is soft. There is no hepatomegaly or splenomegaly.     Tenderness: There is abdominal tenderness in the suprapubic area and left lower quadrant. There is rebound. There is no guarding.     Hernia: No hernia is present.  Musculoskeletal:        General: Normal range of motion.     Cervical back: Normal range of motion.  Skin:    General: Skin is warm and dry.  Neurological:     Mental Status: He is alert.      UC Treatments / Results  Labs (all labs ordered are listed, but only abnormal results are displayed) Labs Reviewed  POCT URINALYSIS DIP (MANUAL ENTRY)    EKG   Radiology DG Abdomen 1 View Result Date: 04/28/2024 CLINICAL DATA:  Abdominal pain. EXAM: ABDOMEN - 1 VIEW COMPARISON:  CT abdomen/pelvis dated 06/19/2021. FINDINGS: Nonobstructive bowel gas pattern. Moderate volume of stool throughout the colon. No free air. Visualized lung bases are clear. No acute osseous abnormality. IMPRESSION: Nonobstructive bowel gas pattern. Moderate volume of stool throughout the colon. Electronically Signed   By: Harrietta Sherry M.D.   On: 04/28/2024 11:18    Procedures Procedures (including critical care time)  Medications Ordered in UC Medications - No data to display  Initial Impression / Assessment and Plan / UC Course  I have reviewed the triage vital signs and the nursing notes.  Pertinent labs & imaging results that were available during my care of the patient were reviewed by me and considered in my medical decision making (see chart for details).     UA is unremarkable.  The KUB does show that he has a large stool burden.  This does not account for the fact that he has rebound on exam and the left lower quadrant abdominal pain which may be an early colitis or diverticulitis.  Admitted treated with antibiotics, have him take MiraLAX to get cleared out, follow-up with Dr. Curtis.  Since he says he has  abdominal pain on a regular basis I think he should follow-up with Dr. San Final Clinical Impressions(s) / UC Diagnoses  Final diagnoses:  Colitis  History of chronic constipation  Diverticular disease of colon     Discharge Instructions      Drink lots of water Take MiraLAX daily Bland or limited diet until you are back to normal Take antibiotic as directed, ciprofloxacin  and metronidazole  2 times a day Call to get a follow-up appoint with Dr. Cirigliano      ED Prescriptions     Medication Sig Dispense Auth. Provider   ciprofloxacin  (CIPRO ) 500 MG tablet Take 1 tablet (500 mg total) by mouth 2 (two) times daily. 14 tablet Maranda Jamee Jacob, MD   metroNIDAZOLE  (FLAGYL ) 500 MG tablet Take 1 tablet (500 mg total) by mouth 2 (two) times daily. 14 tablet Maranda Jamee Jacob, MD      PDMP not reviewed this encounter.   Maranda Jamee Jacob, MD 04/28/24 1140

## 2024-05-19 ENCOUNTER — Other Ambulatory Visit: Payer: Self-pay

## 2024-05-19 ENCOUNTER — Encounter: Payer: Self-pay | Admitting: Gastroenterology

## 2024-05-19 DIAGNOSIS — R1030 Lower abdominal pain, unspecified: Secondary | ICD-10-CM

## 2024-05-19 DIAGNOSIS — Z8719 Personal history of other diseases of the digestive system: Secondary | ICD-10-CM

## 2024-05-23 ENCOUNTER — Telehealth: Payer: Self-pay | Admitting: Gastroenterology

## 2024-05-23 ENCOUNTER — Ambulatory Visit (HOSPITAL_COMMUNITY)

## 2024-05-23 NOTE — Telephone Encounter (Signed)
 Patient is calling due to him stating that his CT scan was cancel for today due to him not having any insurance on file. Patient is requesting to follow up with Dr. San nurse. Please advise.

## 2024-05-23 NOTE — Telephone Encounter (Signed)
 Patient does have insurance, it looks like authorization for the scan is still pending. Will follow up with pre-certification team.

## 2024-05-24 ENCOUNTER — Ambulatory Visit: Payer: Self-pay | Admitting: Gastroenterology

## 2024-05-24 ENCOUNTER — Other Ambulatory Visit (INDEPENDENT_AMBULATORY_CARE_PROVIDER_SITE_OTHER)

## 2024-05-24 ENCOUNTER — Ambulatory Visit (INDEPENDENT_AMBULATORY_CARE_PROVIDER_SITE_OTHER): Admitting: Gastroenterology

## 2024-05-24 ENCOUNTER — Encounter: Payer: Self-pay | Admitting: Gastroenterology

## 2024-05-24 DIAGNOSIS — K5790 Diverticulosis of intestine, part unspecified, without perforation or abscess without bleeding: Secondary | ICD-10-CM

## 2024-05-24 DIAGNOSIS — K5900 Constipation, unspecified: Secondary | ICD-10-CM

## 2024-05-24 DIAGNOSIS — K5909 Other constipation: Secondary | ICD-10-CM

## 2024-05-24 DIAGNOSIS — R1032 Left lower quadrant pain: Secondary | ICD-10-CM | POA: Diagnosis not present

## 2024-05-24 DIAGNOSIS — R1030 Lower abdominal pain, unspecified: Secondary | ICD-10-CM

## 2024-05-24 LAB — BASIC METABOLIC PANEL WITH GFR
BUN: 12 mg/dL (ref 6–23)
CO2: 32 meq/L (ref 19–32)
Calcium: 9.3 mg/dL (ref 8.4–10.5)
Chloride: 103 meq/L (ref 96–112)
Creatinine, Ser: 1.01 mg/dL (ref 0.40–1.50)
GFR: 88.94 mL/min (ref >60.00)
Glucose, Bld: 91 mg/dL (ref 70–99)
Potassium: 4.3 meq/L (ref 3.5–5.1)
Sodium: 140 meq/L (ref 135–145)

## 2024-05-24 LAB — C-REACTIVE PROTEIN: CRP: <1 mg/dL (ref 0.5–20.0)

## 2024-05-24 LAB — SEDIMENTATION RATE: Sed Rate: 7 mm/h (ref 0–15)

## 2024-05-24 NOTE — Progress Notes (Signed)
 Chief Complaint:    Abdominal pain   HPI:     Patient is a 48 y.o. male presenting to the Gastroenterology Clinic for evaluation of LLQ pain.  Last seen by me on 04/30/2021.  Main issue is actually chronic constipation.  Tends to take fiber Gummies and Metamucil most days, then Senokot on demand.  Has used MiraLAX with improvement.  He also made dietary modifications to include increase dietary fiber and eating less fast food.  Drinks about 24 ounces of water daily.  Without medications, he will go several days without BM.  Tends to have pushing/straining to have BM.  No hematochezia.  Additionally, prior history of diverticulitis as outlined below. He was seen in Urgent Care on 04/28/2024 for LLQ pain.  UC notes reviewed. Vital signs normal and afebrile, exam with suprapubic and LLQ tenderness. UA unremarkable, abdominal x-ray with moderate stool throughout the colon. No other labs or imaging for review, and he was recommended to hydrate, MiraLAX, and given course of Cipro /Flagyl .  Completed ABX as prescribed.  Reports LLQ pain overall improving, but not resolved.  He called in last week with ongoing symptoms and  I had recommended getting a CT, which was scheduled for 8/4, but patient unable to get this done due to insurance.    GI history: - Reports having colitis at age 79, diagnosed clinically without w/o colonoscopy - 03/13/2021: ER evaluation for lower abdominal pain.  WBC 10.2. CT abdomen/pelvis with extensive wall thickening/inflammation in the sigmoid without abscess. He was diagnosed with diverticulitis, treated with Augmentin  x10 days. - 03/21/2021: Partial relief with Augmentin  and PCP prescribed Cipro /Flagyl  - 04/30/2021: Evaluation in the GI clinic.  Continued lower abdominal pain.  Longstanding history of irregular stools with intermittent constipation.  Recommended colonoscopy in 8+ weeks, trialed course of Bentyl , probiotic  Endoscopic History: -Colonoscopy in 08/2018:  Sigmoid diverticulosis, internal hemorrhoids, otherwise normal.  - 05/21/2021: Colonoscopy: 5 mm sigmoid adenoma, 3 mm sigmoid polyp located at the base of a diverticulum (benign granulation tissue), 3 mm sigmoid polyp located at the edge of a diverticulum (hyperplastic polyp), 2 small 5-8 mm rectosigmoid polyps emanating from diverticula (benign granulation tissue), 2 diminutive rectal hyperplastic polyps.  Internal hemorrhoids, sigmoid diverticulosis.  Normal TI.  Repeat in 7 years    Review of systems:     No chest pain, no SOB, no fevers, no urinary sx   Past Medical History:  Diagnosis Date   Allergy    Colitis    Hyperlipidemia     Patient's surgical history, family medical history, social history, medications and allergies were all reviewed in Epic    Current Outpatient Medications  Medication Sig Dispense Refill   rosuvastatin  (CRESTOR ) 20 MG tablet Take 1 tablet (20 mg total) by mouth daily. (Patient taking differently: Take 20 mg by mouth daily. As needed) 90 tablet 3   traZODone  (DESYREL ) 150 MG tablet Take 1 tablet (150 mg total) by mouth at bedtime. 90 tablet 3   ciprofloxacin  (CIPRO ) 500 MG tablet Take 1 tablet (500 mg total) by mouth 2 (two) times daily. (Patient not taking: Reported on 05/24/2024) 14 tablet 0   metroNIDAZOLE  (FLAGYL ) 500 MG tablet Take 1 tablet (500 mg total) by mouth 2 (two) times daily. (Patient not taking: Reported on 05/24/2024) 14 tablet 0   No current facility-administered medications for this visit.    Physical Exam:     BP 120/76   Pulse 63   Ht 5' 8 (1.727 m)   Wt 170  lb (77.1 kg)   BMI 25.85 kg/m   GENERAL:  Pleasant male in NAD PSYCH: : Cooperative, normal affect EENT:  conjunctiva pink, mucous membranes moist, neck supple without masses CARDIAC:  RRR, no murmur heard, no peripheral edema PULM: Normal respiratory effort, lungs CTA bilaterally, no wheezing ABDOMEN: Minimal TTP in lower abdomen without rebound or guarding.  No peritoneal  signs.  Nondistended, soft. No obvious masses, no hepatomegaly,  normal bowel sounds SKIN:  turgor, no lesions seen Musculoskeletal:  Normal muscle tone, normal strength NEURO: Alert and oriented x 3, no focal neurologic deficits   IMPRESSION and PLAN:    1) Chronic constipation 2) Lower abdominal pain 3) Diverticulosis Somewhat difficult to parse out which of his symptoms are related in which are true-true but unrelated.  Does have chronic constipation and has been treating with Senokot and occasional MiraLAX.  Constipation may also contribute to his lower abdominal pain.  Additionally, known history of diverticulosis and at least 1 documented episode of diverticulitis in 2022.  Subsequent colonoscopy without features of segmental colitis at that time.  Did have exacerbation of pain last month prompting him to go to urgent care and was treated for clinical diagnosis of diverticulitis, but no repeat imaging or labs.  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  X-ray last month with an stool burden.  - Increase hydration with a goal of 64 ounces of water daily - Refer for Anorectal manometry - Pending on results, may also plan for sits marker study - CT abdomen/pelvis to evaluate for active inflammation - Check CBC, ESR, CRP - Depending on CT results, may also benefit from empiric course of 5-ASA therapy for possible SCAD - Consider Bentyl  on demand  I spent 35 minutes of time, including in depth chart review, independent review of results as outlined above, communicating results with the patient directly, face-to-face time with the patient, coordinating care, ordering studies and medications as appropriate, and documentation.            Peter Knight ,DO, FACG 05/24/2024, 10:06 AM

## 2024-05-24 NOTE — Patient Instructions (Addendum)
 _______________________________________________________  If your blood pressure at your visit was 140/90 or greater, please contact your primary care physician to follow up on this.  If you are age 47 or younger, your body mass index should be between 19-25. Your Body mass index is 25.85 kg/m. If this is out of the aformentioned range listed, please consider follow up with your Primary Care Provider.  ________________________________________________________  The [facility] providers would like to encourage you to use MYCHART to communicate with providers for non-urgent requests or questions.  Due to long hold times on the telephone, sending your provider a message by [facility] may be a faster and more efficient way to get a response.  Please allow 48 business hours for a response.  Please remember that this is for non-urgent requests.  _______________________________________________________  [facility] is using a team-based approach to care.  Your team is made up of your doctor and two to three APPS. Our APPS (Nurse Practitioners and Physician Assistants) work with your physician to ensure care continuity for you. They are fully qualified to address your health concerns and develop a treatment plan. They communicate directly with your gastroenterologist to care for you. Seeing the Advanced Practice Practitioners on your physician's team can help you by facilitating care more promptly, often allowing for earlier appointments, access to diagnostic testing, procedures, and other specialty referrals.   Your provider has requested that you go to the basement level for lab work before leaving today. Press B on the elevator. The lab is located at the first door on the left as you exit the elevator.  You have been scheduled for a CT scan of the abdomen and pelvis at [facility], 1st floor Radiology. You are scheduled on 06-02-24 at 11:30am. You should arrive at 9:15am for registration  and to drink contrast.   Please follow the written instructions below on the day of your exam:   1) Do not eat anything after 7:30am (4 hours prior to your test)   You may take any medications as prescribed with a small amount of water, if necessary. If you take any of the following medications: METFORMIN, GLUCOPHAGE, GLUCOVANCE, AVANDAMET, RIOMET, FORTAMET, ACTOPLUS MET, JANUMET, GLUMETZA or METAGLIP, you MAY be asked to HOLD this medication 48 hours AFTER the exam.   The purpose of you drinking the oral contrast is to aid in the visualization of your intestinal tract. The contrast solution may cause some diarrhea. Depending on your individual set of symptoms, you may also receive an intravenous injection of x-ray contrast/dye. Plan on being at [facility] for 45 minutes or longer, depending on the type of exam you are having performed.   If you have any questions regarding your exam or if you need to reschedule, you may call [facility] at [phone] between the hours of 8:00 am and 5:00 pm, Monday-Friday.   You have been scheduled to have an anorectal manometry at [facility] on 07-29-24 at 12:30pm. Please arrive 30 minutes prior to your appointment time for registration (1st floor of the hospital-admissions).  Please make certain to use 1 Fleets enema 2 hours prior to coming for your appointment. You can purchase Fleets enemas from the laxative section at your drug store. You should not eat anything during the two hours prior to the procedure. You may take regular medications with small sips of water at least 2 hours prior to the study.  Anorectal manometry is a test performed to evaluate patients with constipation or  fecal incontinence. This test measures the pressures of the anal sphincter muscles, the sensation in the rectum, and the neural reflexes that are needed for normal bowel movements.  THE PROCEDURE The test takes approximately 30 minutes to 1 hour. You will be  asked to change into a hospital gown. A technician or nurse will explain the procedure to you, take a brief health history, and answer any questions you may have. The patient then lies on his or her left side. A small, flexible tube, about the size of a thermometer, with a balloon at the end is inserted into the rectum. The catheter is connected to a machine that measures the pressure. During the test, the small balloon attached to the catheter may be inflated in the rectum to assess the normal reflex pathways. The nurse or technician may also ask the person to squeeze, relax, and push at various times. The anal sphincter muscle pressures are measured during each of these maneuvers. To squeeze, the patient tightens the sphincter muscles as if trying to prevent anything from coming out. To push or bear down, the patient strains down as if trying to have a bowel movement.  Due to recent changes in healthcare laws, you may see the results of your imaging and laboratory studies on MyChart before your provider has had a chance to review them.  We understand that in some cases there may be results that are confusing or concerning to you. Not all laboratory results come back in the same time frame and the provider may be waiting for multiple results in order to interpret others.  Please give us  48 hours in order for your provider to thoroughly review all the results before contacting the office for clarification of your results.   It was a pleasure to see you today!  Peter Knight, D.O.

## 2024-06-02 ENCOUNTER — Ambulatory Visit (HOSPITAL_COMMUNITY)
Admission: RE | Admit: 2024-06-02 | Discharge: 2024-06-02 | Disposition: A | Discharge status: Home / Self Care | Source: Ambulatory Visit | Attending: Gastroenterology | Admitting: Gastroenterology

## 2024-06-02 DIAGNOSIS — Z8719 Personal history of other diseases of the digestive system: Secondary | ICD-10-CM | POA: Diagnosis present

## 2024-06-02 DIAGNOSIS — R1030 Lower abdominal pain, unspecified: Secondary | ICD-10-CM | POA: Diagnosis present

## 2024-06-02 MED ORDER — IOHEXOL 300 MG/ML  SOLN
100.0000 mL | Freq: Once | INTRAMUSCULAR | Status: AC | PRN
  Administered 2024-06-02: 100 mL via INTRAVENOUS

## 2024-06-02 MED ORDER — IOHEXOL 9 MG/ML PO SOLN
500.0000 mL | ORAL | Status: AC
  Administered 2024-06-02 (×2): 500 mL via ORAL

## 2024-06-08 ENCOUNTER — Ambulatory Visit: Payer: Self-pay | Admitting: Gastroenterology

## 2024-06-21 ENCOUNTER — Encounter: Payer: Self-pay | Admitting: Sports Medicine

## 2024-07-15 NOTE — Progress Notes (Signed)
 Pt called and left message and wants to cancel this AR.

## 2024-07-29 ENCOUNTER — Encounter (HOSPITAL_COMMUNITY): Admission: RE | Payer: Self-pay | Source: Home / Self Care

## 2024-07-29 ENCOUNTER — Ambulatory Visit (HOSPITAL_COMMUNITY): Admission: RE | Admit: 2024-07-29 | Source: Home / Self Care | Admitting: Gastroenterology

## 2024-07-29 SURGERY — MANOMETRY, ANORECTAL

## 2024-09-28 ENCOUNTER — Telehealth: Payer: Self-pay | Admitting: *Deleted

## 2024-09-28 ENCOUNTER — Telehealth: Payer: Self-pay | Admitting: Family Medicine

## 2024-09-28 ENCOUNTER — Other Ambulatory Visit: Payer: Self-pay | Admitting: *Deleted

## 2024-09-28 DIAGNOSIS — F5101 Primary insomnia: Secondary | ICD-10-CM

## 2024-09-28 MED ORDER — TRAZODONE HCL 150 MG PO TABS: 150.0000 mg | ORAL_TABLET | Freq: Every day | ORAL | 0 refills | Status: AC

## 2024-09-28 NOTE — Telephone Encounter (Unsigned)
 Copied from CRM #[id number]. Topic: Appointments - Scheduling Inquiry for Clinic >> Sep 28, 2024  2:06 PM Winona R wrote: Pt calling to schedule a TOC appointment with Laqueta Ku as Dr. ONEIDA recommenced him and the pt father also sees Dr. Velma. Pt would like to know if [facility] accept him as a new pt.

## 2024-09-28 NOTE — Telephone Encounter (Signed)
 Please call pt he will need to do a TOC to get any more refills on medications.

## 2024-09-30 NOTE — Telephone Encounter (Signed)
 Called patient to schedule a toc he stated he would call back at another time to schedule
